# Patient Record
Sex: Male | Born: 1980
Health system: Southern US, Community
[De-identification: ages and names within clinical notes are randomized; demographics above are authoritative.]

## PROBLEM LIST (undated history)

## (undated) DIAGNOSIS — Z9289 Personal history of other medical treatment: Secondary | ICD-10-CM

## (undated) DIAGNOSIS — I10 Essential (primary) hypertension: Secondary | ICD-10-CM

## (undated) HISTORY — PX: TONSILLECTOMY: SUR1361

## (undated) HISTORY — DX: Personal history of other medical treatment: Z92.89

---

## 2007-06-12 ENCOUNTER — Ambulatory Visit (HOSPITAL_BASED_OUTPATIENT_CLINIC_OR_DEPARTMENT_OTHER): Admission: RE | Admit: 2007-06-12 | Discharge: 2007-06-12 | Payer: Self-pay | Admitting: Otolaryngology

## 2007-06-12 ENCOUNTER — Encounter (INDEPENDENT_AMBULATORY_CARE_PROVIDER_SITE_OTHER): Payer: Self-pay | Admitting: Otolaryngology

## 2007-09-12 ENCOUNTER — Emergency Department (HOSPITAL_COMMUNITY): Admission: EM | Admit: 2007-09-12 | Discharge: 2007-09-12 | Payer: Self-pay | Admitting: Emergency Medicine

## 2009-09-17 ENCOUNTER — Emergency Department (HOSPITAL_COMMUNITY): Admission: EM | Admit: 2009-09-17 | Discharge: 2009-09-17 | Payer: Self-pay | Admitting: Emergency Medicine

## 2010-01-15 IMAGING — CR DG FOOT COMPLETE 3+V*L*
3 series · 3 of 3 positions shown · non-contrast
Comparison: None.

CLINICAL DATA: Left foot laceration.  Question foreign body over
the fifth metatarsal bone.

LEFT FOOT - COMPLETE 3+ VIEW

[view not recorded (1 of 3)]
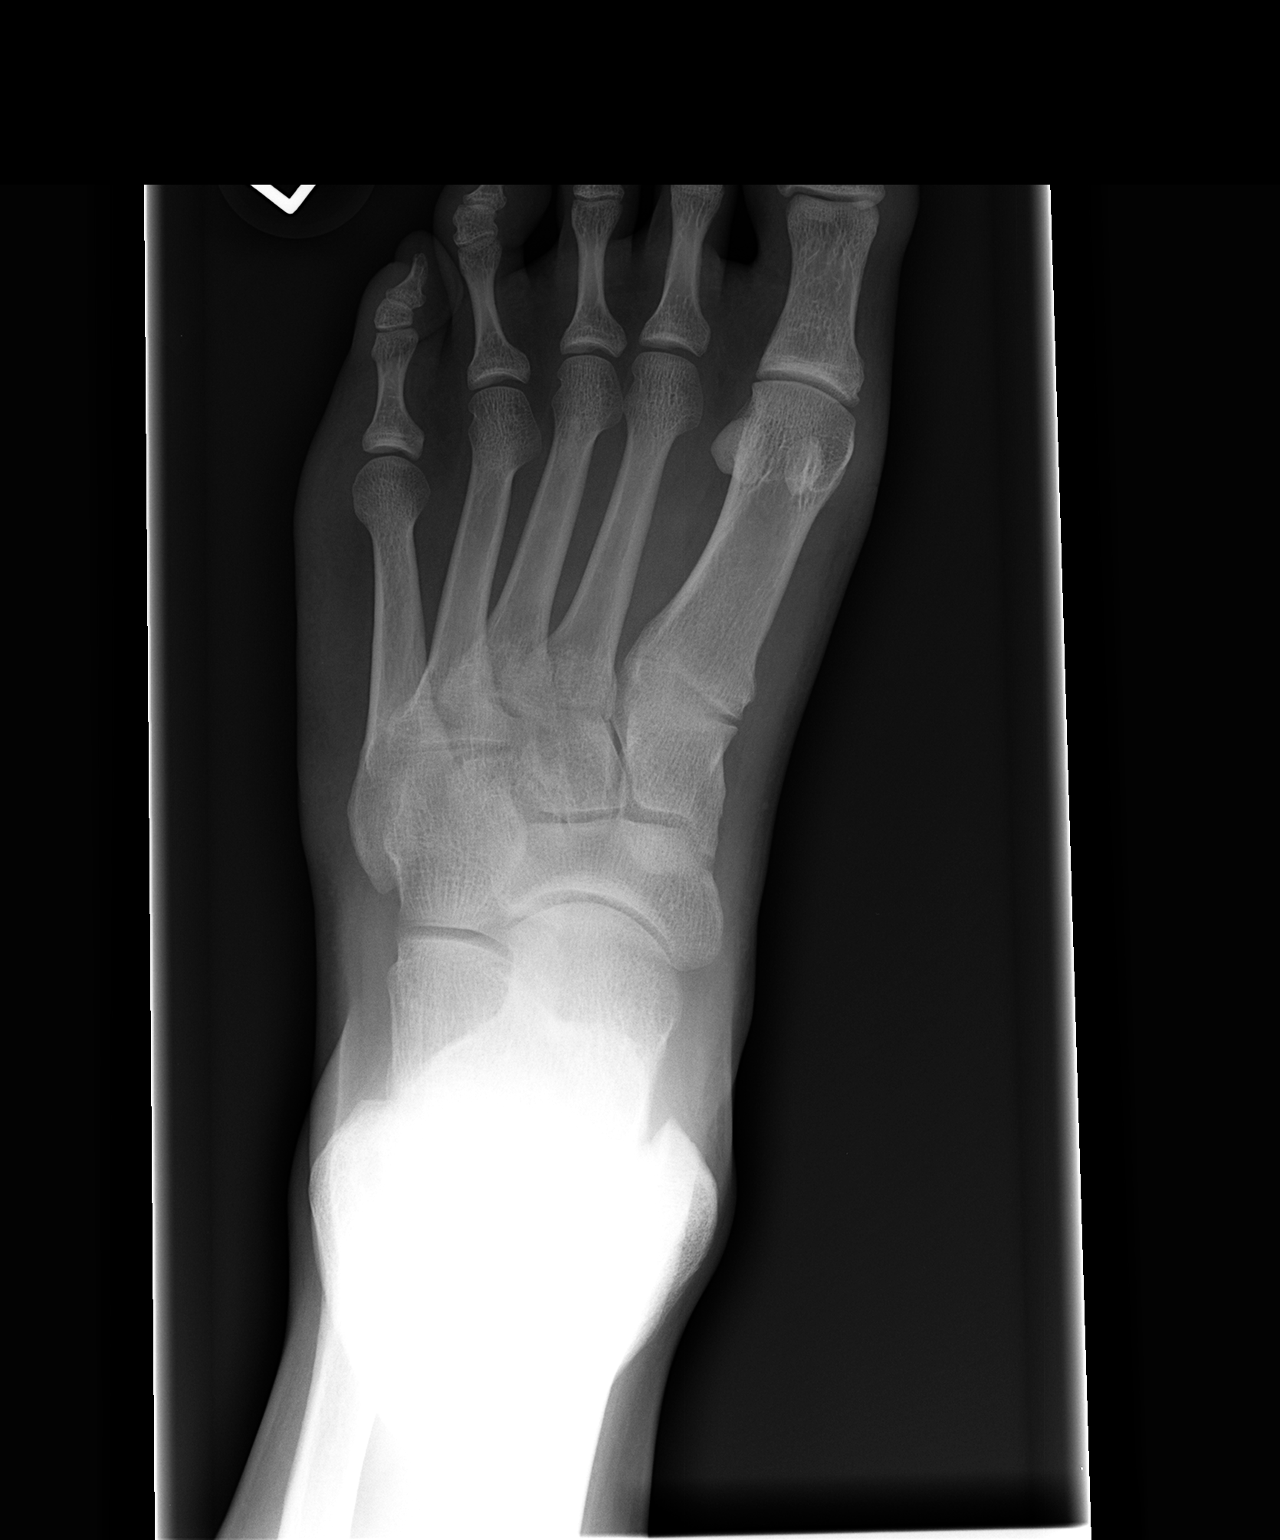

[view not recorded (2 of 3)]
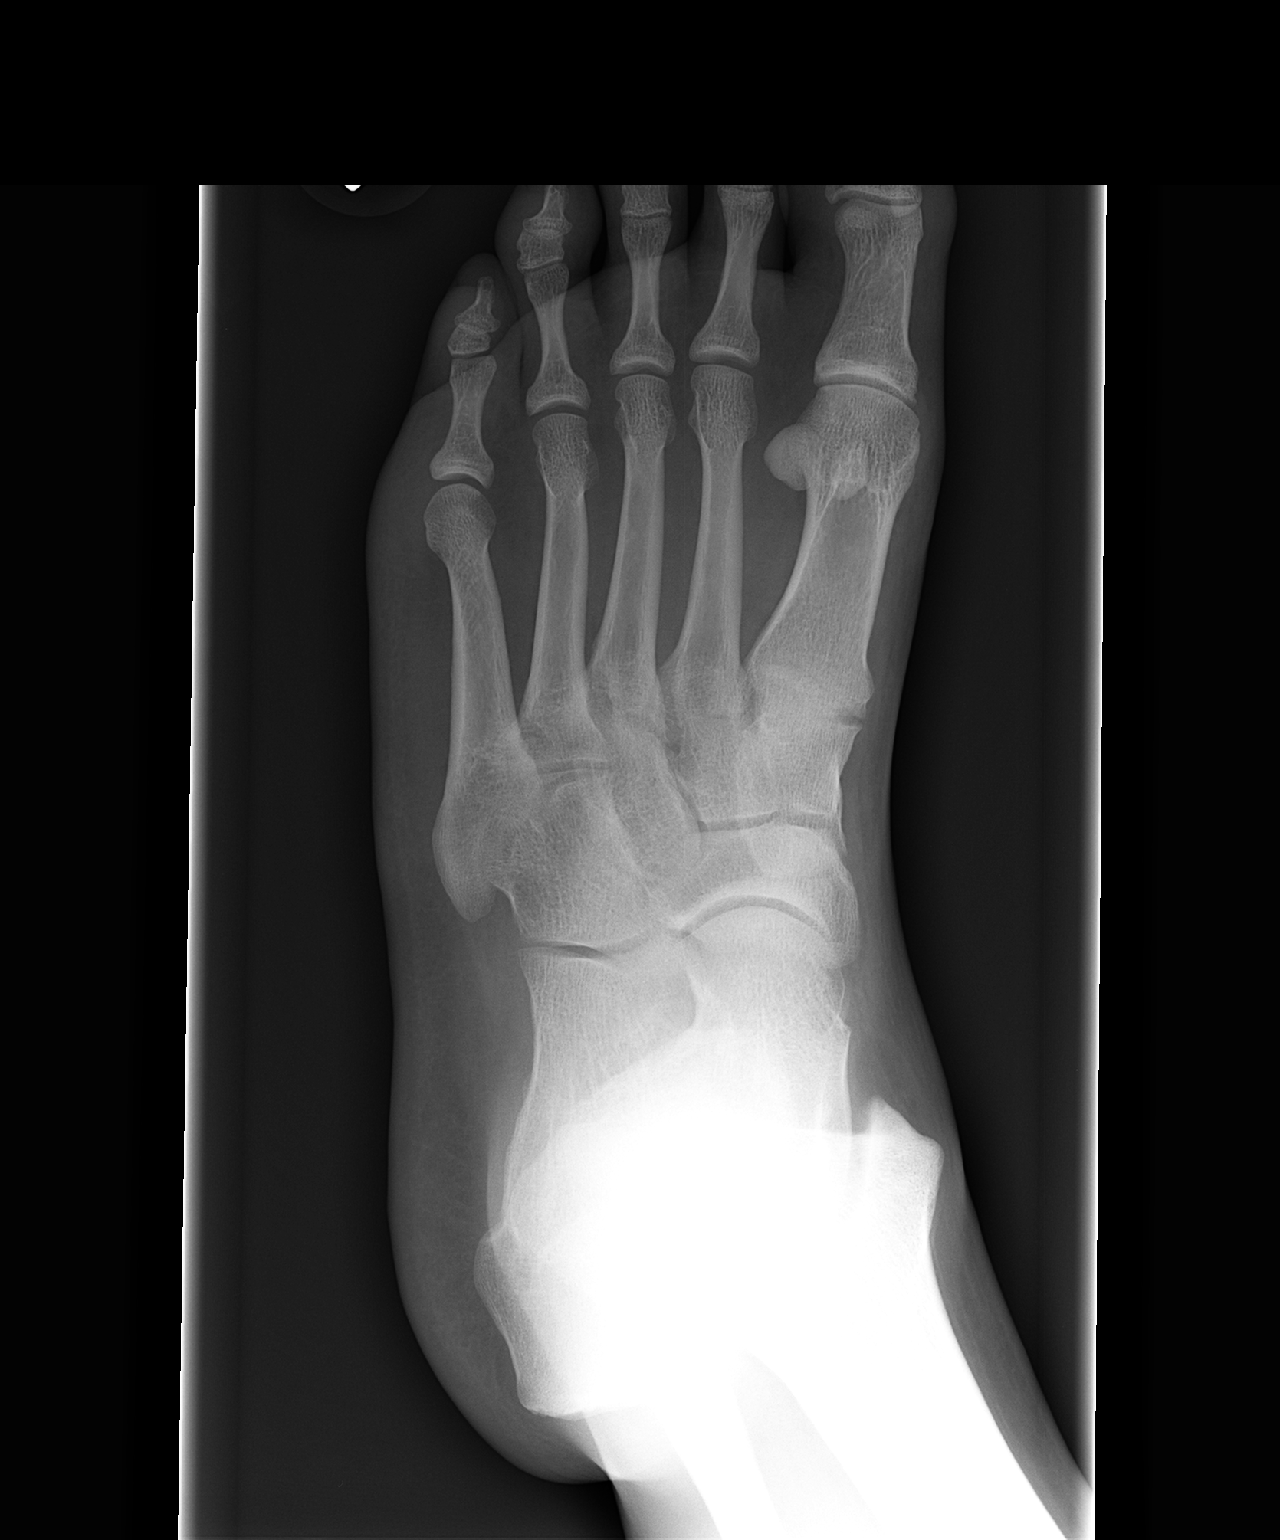

[view not recorded (3 of 3)]
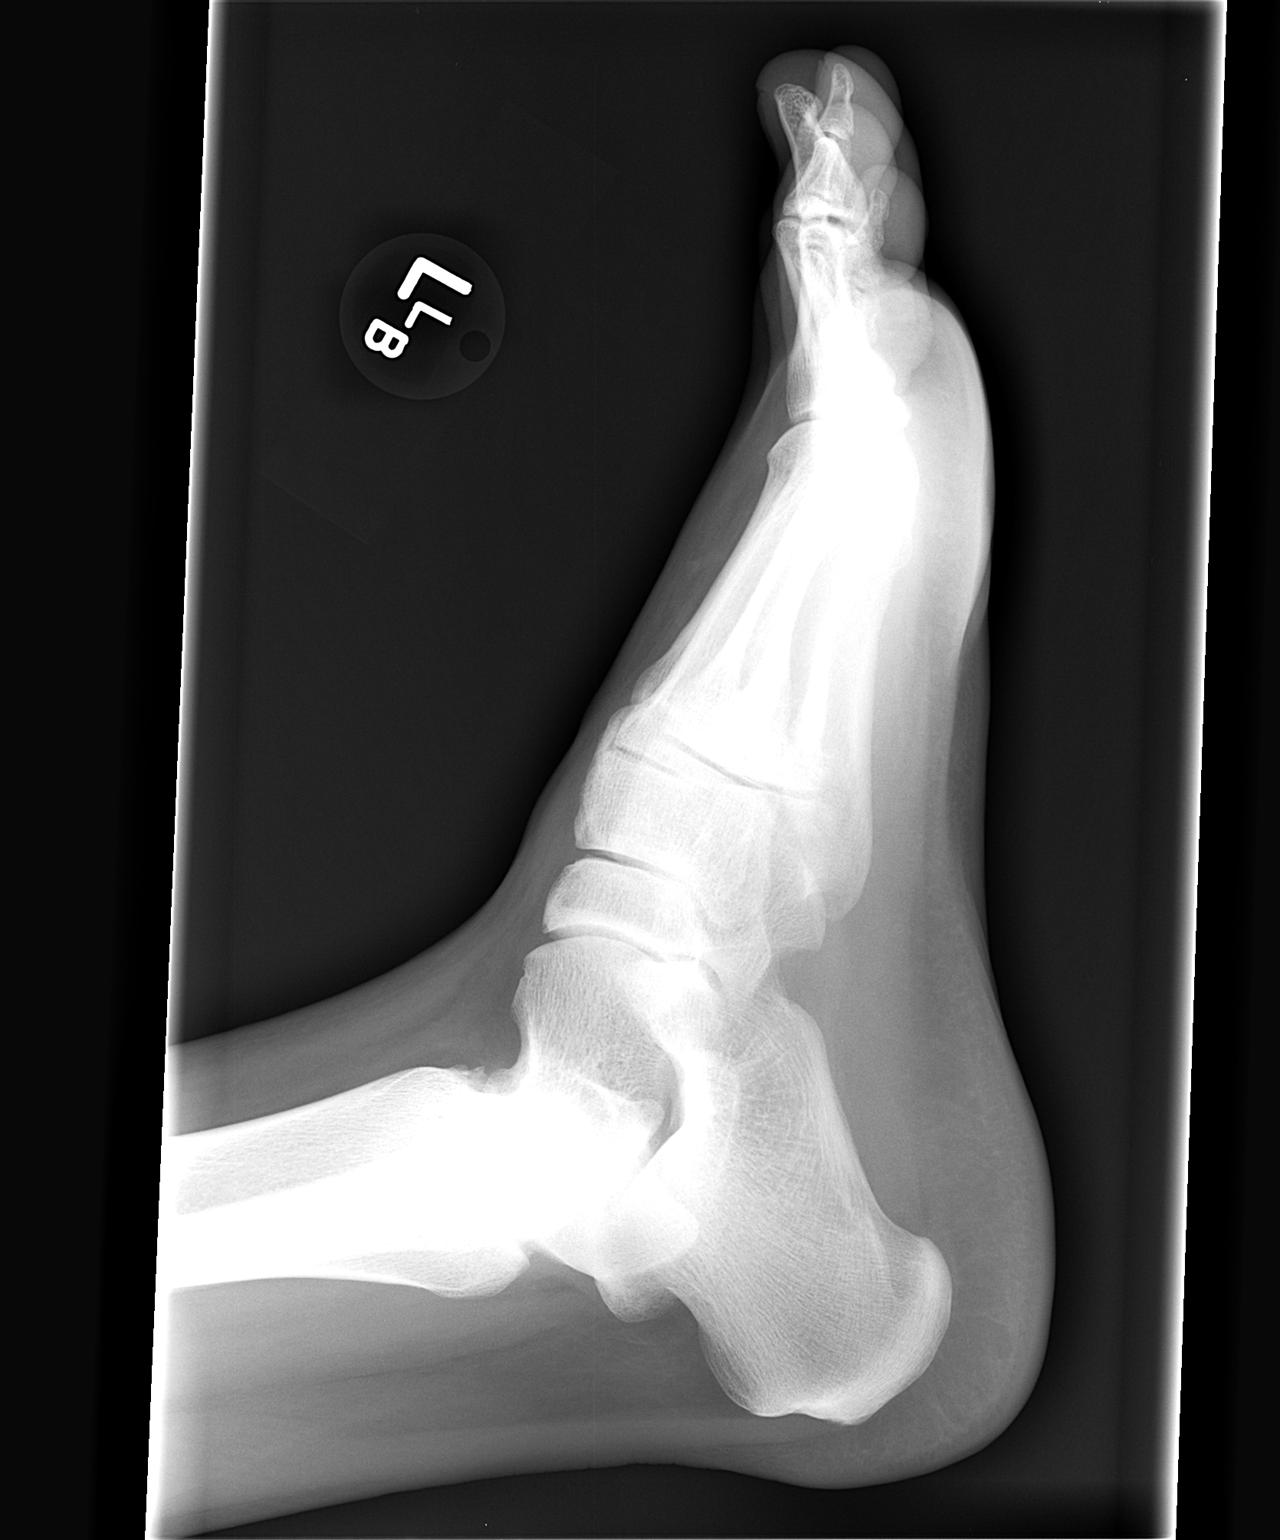

[3 of 3 positions shown; findings below may reference images not displayed]

FINDINGS: There is soft tissue swelling over the lateral aspect the
foot.  No underlying radiopaque foreign body is seen.  There is no
evidence for fracture or dislocation.  The foot is otherwise
unremarkable.
IMPRESSION: 1.  Soft tissue swelling over lateral aspect the foot without
underlying foreign body, fracture, or dislocation.

## 2010-06-10 LAB — POCT I-STAT, CHEM 8
BUN: 8 mg/dL (ref 6–23)
Calcium, Ion: 1.17 mmol/L (ref 1.12–1.32)
Creatinine, Ser: 1 mg/dL (ref 0.4–1.5)
Glucose, Bld: 109 mg/dL — ABNORMAL HIGH (ref 70–99)
Hemoglobin: 16 g/dL (ref 13.0–17.0)
Sodium: 137 mEq/L (ref 135–145)
TCO2: 26 mmol/L (ref 0–100)

## 2010-08-07 NOTE — Op Note (Signed)
NAME:  JASKIRAT, SCHWIEGER NO.:  [id number]   MEDICAL RECORD NO.:  [id number]          PATIENT TYPE:  AMB   LOCATION:  DSC                          FACILITY:  [facility]   PHYSICIAN:  Antony Contras, MD     DATE OF BIRTH:  10/02/1980   DATE OF PROCEDURE:  06/12/2007  DATE OF DISCHARGE:                               OPERATIVE REPORT   PREOPERATIVE DIAGNOSIS:  Chronic tonsillitis and tonsilliths.   POSTOPERATIVE DIAGNOSIS:  Chronic tonsillitis and tonsilliths.   PROCEDURE:  Tonsillectomy.   SURGEON:  Christia Reading, MD.   ANESTHESIA:  General endotracheal anesthesia.   COMPLICATIONS:  None.   INDICATION:  The patient is a 30 year old white male who has a long  history of debris collecting in his tonsils that has become quite  aggravating and common.  It causes bad breath.  He presents to the  operating room for surgical management.   FINDINGS:  The tonsils were 1+ in size bilaterally with crypt debris in  both sides.   DESCRIPTION OF PROCEDURE:  The patient was identified in the holding  room and informed consent having been obtained, including discussion of  risks, benefits, and alternatives, the patient was brought to the  operative suite and put on the operative table in the supine position.  Anesthesia was induced and patient was intubated by an anesthesia team  using a glide scope.  The eyes were draped closed and bed was turned 90  degrees from anesthesia.  The patient was given intravenous antibiotics  and steroids during the case.  A head wrap was placed around the  patient's head and a Crowe-Davis retractor was inserted in the mouth and  opened to reveal the oropharynx.  It was placed in suspension on the  Mayo stand.  The left tonsil was grasped with a curved Allis and  retracted medially while a curvilinear incision was made along the  anterior tonsillar pillar using the coblator on a setting of 7.  Dissection continued in the subcapsular plane until the  tonsil was  removed.  The tonsil was passed to nursing for pathology.  Bleeding was  then controlled using suction cautery on a setting of 30.  The same  procedure was then carried out on the right side.  After this, the nose  and throat were copiously irrigated with saline and a flexible catheter  was passed down the esophagus to suck out the stomach and esophagus.  The retractor was taken out of the suspension and removed from the  patient's mouth and he was turned back to anesthesia for wake-up and was  extubated and moved to the recovery room in stable condition.     Antony Contras, MD  Electronically Signed    DDB/MEDQ  D:  06/12/2007  T:  06/12/2007  Job:  [id number]

## 2010-12-17 LAB — POCT I-STAT, CHEM 8
Calcium, Ion: 1.24
Creatinine, Ser: 0.8
Glucose, Bld: 94
Hemoglobin: 14.6
Potassium: 4

## 2015-03-08 ENCOUNTER — Emergency Department (INDEPENDENT_AMBULATORY_CARE_PROVIDER_SITE_OTHER)
Admission: EM | Admit: 2015-03-08 | Discharge: 2015-03-08 | Disposition: A | Discharge status: Home / Self Care | Payer: BLUE CROSS/BLUE SHIELD | Source: Home / Self Care | Attending: Family Medicine | Admitting: Family Medicine

## 2015-03-08 ENCOUNTER — Encounter (HOSPITAL_COMMUNITY): Payer: Self-pay | Admitting: *Deleted

## 2015-03-08 DIAGNOSIS — I10 Essential (primary) hypertension: Secondary | ICD-10-CM | POA: Diagnosis not present

## 2015-03-08 HISTORY — DX: Essential (primary) hypertension: I10

## 2015-03-08 LAB — POCT I-STAT, CHEM 8
BUN: 13 mg/dL (ref 6–20)
CALCIUM ION: 1.23 mmol/L (ref 1.12–1.23)
CREATININE: 0.9 mg/dL (ref 0.61–1.24)
Chloride: 101 mmol/L (ref 101–111)
GLUCOSE: 98 mg/dL (ref 65–99)
HCT: 46 % (ref 39.0–52.0)
HEMOGLOBIN: 15.6 g/dL (ref 13.0–17.0)
Potassium: 3.8 mmol/L (ref 3.5–5.1)
Sodium: 140 mmol/L (ref 135–145)
TCO2: 27 mmol/L (ref 0–100)

## 2015-03-08 MED ORDER — LISINOPRIL-HYDROCHLOROTHIAZIDE 10-12.5 MG PO TABS: 1.0000 | ORAL_TABLET | Freq: Every day | ORAL | Status: DC

## 2015-03-08 NOTE — ED Provider Notes (Signed)
CSN: [id number]     Arrival date & time 03/08/15  1533 History   First MD Initiated Contact with Patient 03/08/15 1703     Chief Complaint  Patient presents with  . Hypertension   (Consider location/radiation/quality/duration/timing/severity/associated sxs/prior Treatment) Patient is a 34 y.o. male presenting with hypertension. The history is provided by the patient.  Hypertension This is a chronic problem. The current episode started 3 to 5 hours ago (h/o hbp,  no med for sev yrs, today was found to have elevated bp 160/100. has regained wt lost when first put on meds.). The problem has not changed since onset.Pertinent negatives include no chest pain and no shortness of breath. Associated symptoms comments: None..    Past Medical History  Diagnosis Date  . Hypertension    History reviewed. No pertinent past surgical history. History reviewed. No pertinent family history. Social History  Substance Use Topics  . Smoking status: Never Smoker   . Smokeless tobacco: None  . Alcohol Use: Yes    Review of Systems  Constitutional: Negative.   Respiratory: Negative.  Negative for cough, shortness of breath and wheezing.   Cardiovascular: Negative.  Negative for chest pain, palpitations and leg swelling.  Neurological: Negative.   All other systems reviewed and are negative.   Allergies  Review of patient's allergies indicates no known allergies.  Home Medications   Prior to Admission medications   Medication Sig Start Date End Date Taking? Authorizing Provider  lisinopril-hydrochlorothiazide (PRINZIDE,ZESTORETIC) 10-12.5 MG tablet Take 1 tablet by mouth daily. 03/08/15   Linna HoffJames D Kryssa Risenhoover, MD   Meds Ordered and Administered this Visit  Medications - No data to display  BP 165/106 mmHg  Pulse 83  Temp(Src) 98.4 F (36.9 C) (Oral)  Resp 16  SpO2 97% No data found.   Physical Exam  Constitutional: He is oriented to person, place, and time. He appears well-developed and  well-nourished.  Neck: Normal range of motion. Neck supple.  Cardiovascular: Normal rate, regular rhythm, normal heart sounds and intact distal pulses.   Pulmonary/Chest: Effort normal and breath sounds normal.  Musculoskeletal: He exhibits no edema.  Neurological: He is alert and oriented to person, place, and time.  Skin: Skin is warm and dry.  Nursing note and vitals reviewed.   ED Course  Procedures (including critical care time)  Labs Review Labs Reviewed  POCT I-STAT, CHEM 8   i-stat wnl.  Imaging Review No results found.   Visual Acuity Review  Right Eye Distance:   Left Eye Distance:   Bilateral Distance:    Right Eye Near:   Left Eye Near:    Bilateral Near:         MDM   1. Essential hypertension        Linna HoffJames D Thai Burgueno, MD 03/08/15 1843

## 2015-03-08 NOTE — ED Notes (Signed)
Pt  Reports      He  Has  A  History     Of  Hypertension in  Past        He    Used  To  Be  On lisinopril     He  Has  Not  Had  Any in  4  Years          He     Reports  His  bp  Was  High   Today  When he  Was  Getting a  Work  Physical     He  denys any symptoms

## 2015-03-08 NOTE — Discharge Instructions (Signed)
See your doctor in 2-3 weeks for recheck of bp and refill of medicine as indicated.

## 2015-11-03 DIAGNOSIS — I10 Essential (primary) hypertension: Secondary | ICD-10-CM | POA: Diagnosis not present

## 2015-11-03 DIAGNOSIS — Z6835 Body mass index (BMI) 35.0-35.9, adult: Secondary | ICD-10-CM | POA: Diagnosis not present

## 2015-12-14 DIAGNOSIS — Z0001 Encounter for general adult medical examination with abnormal findings: Secondary | ICD-10-CM | POA: Diagnosis not present

## 2015-12-19 DIAGNOSIS — Z23 Encounter for immunization: Secondary | ICD-10-CM | POA: Diagnosis not present

## 2015-12-19 DIAGNOSIS — I10 Essential (primary) hypertension: Secondary | ICD-10-CM | POA: Diagnosis not present

## 2016-02-22 MED FILL — LISINOPRIL 20 MG TABLET: 20 | 90 days supply | Qty: 90 | Fill #0

## 2016-03-27 DIAGNOSIS — Z3141 Encounter for fertility testing: Secondary | ICD-10-CM | POA: Diagnosis not present

## 2016-03-30 ENCOUNTER — Observation Stay (HOSPITAL_COMMUNITY)
Admission: EM | Admit: 2016-03-30 | Discharge: 2016-03-31 | Disposition: A | Discharge status: Home / Self Care | Payer: 59 | Attending: Nephrology | Admitting: Nephrology

## 2016-03-30 ENCOUNTER — Emergency Department (HOSPITAL_COMMUNITY): Payer: 59

## 2016-03-30 ENCOUNTER — Encounter (HOSPITAL_COMMUNITY): Payer: Self-pay | Admitting: *Deleted

## 2016-03-30 DIAGNOSIS — Z79899 Other long term (current) drug therapy: Secondary | ICD-10-CM | POA: Diagnosis not present

## 2016-03-30 DIAGNOSIS — R778 Other specified abnormalities of plasma proteins: Secondary | ICD-10-CM | POA: Diagnosis present

## 2016-03-30 DIAGNOSIS — R7989 Other specified abnormal findings of blood chemistry: Secondary | ICD-10-CM | POA: Diagnosis present

## 2016-03-30 DIAGNOSIS — R05 Cough: Secondary | ICD-10-CM | POA: Insufficient documentation

## 2016-03-30 DIAGNOSIS — J3489 Other specified disorders of nose and nasal sinuses: Secondary | ICD-10-CM | POA: Diagnosis not present

## 2016-03-30 DIAGNOSIS — I10 Essential (primary) hypertension: Secondary | ICD-10-CM | POA: Diagnosis not present

## 2016-03-30 DIAGNOSIS — R0789 Other chest pain: Secondary | ICD-10-CM | POA: Diagnosis not present

## 2016-03-30 DIAGNOSIS — R0981 Nasal congestion: Secondary | ICD-10-CM | POA: Diagnosis not present

## 2016-03-30 DIAGNOSIS — R079 Chest pain, unspecified: Secondary | ICD-10-CM

## 2016-03-30 LAB — TROPONIN I
Troponin I: 0.05 ng/mL (ref <0.03)
Troponin I: 0.07 ng/mL (ref <0.03)

## 2016-03-30 LAB — BASIC METABOLIC PANEL
Anion gap: 5 (ref 5–15)
BUN: 15 mg/dL (ref 6–20)
CALCIUM: 9.1 mg/dL (ref 8.9–10.3)
CO2: 29 mmol/L (ref 22–32)
CREATININE: 0.99 mg/dL (ref 0.61–1.24)
Chloride: 101 mmol/L (ref 101–111)
GFR calc Af Amer: >60 mL/min (ref >60)
GFR calc non Af Amer: >60 mL/min (ref >60)
GLUCOSE: 103 mg/dL — AB (ref 65–99)
Potassium: 4 mmol/L (ref 3.5–5.1)
Sodium: 135 mmol/L (ref 135–145)

## 2016-03-30 LAB — CBC
HCT: 42.5 % (ref 39.0–52.0)
Hemoglobin: 14.6 g/dL (ref 13.0–17.0)
MCH: 30.4 pg (ref 26.0–34.0)
MCHC: 34.4 g/dL (ref 30.0–36.0)
MCV: 88.4 fL (ref 78.0–100.0)
PLATELETS: 315 10*3/uL (ref 150–400)
RBC: 4.81 MIL/uL (ref 4.22–5.81)
RDW: 12.1 % (ref 11.5–15.5)
WBC: 10 10*3/uL (ref 4.0–10.5)

## 2016-03-30 MED ORDER — PANTOPRAZOLE SODIUM 20 MG PO TBEC: 20.0000 mg | DELAYED_RELEASE_TABLET | Freq: Every day | ORAL | 0 refills | Status: DC

## 2016-03-30 NOTE — ED Provider Notes (Signed)
[facility]-EMERGENCY DEPT Provider Note   CSN: [id number] Arrival date & time: 03/30/16  1618     History   Chief Complaint Chief Complaint  Patient presents with  . Chest Pain  . Hypertension    HPI Daniel [place] is a 36 y.o. male.  HPI Patient presents with episodic burning central chest pain over the last 2 days. States that the pain occurs after eating. Had the pain earlier this evening after eating beef stew with [facility] hot sauce. No associated shortness of breath, diaphoresis, nausea or vomiting. Pain is now subsided. Patient is also had URI symptoms including nasal congestion and cough. He is taking over-the-counter medications. States he was not feeling right today and his blood pressure which was significantly elevated. Denied any visual changes, speech changes, focal weakness or numbness. Patient states the symptoms have since resolved. States he's been compliant with a pressure medication. Past Medical History:  Diagnosis Date  . Hypertension     There are no active problems to display for this patient.   Past Surgical History:  Procedure Laterality Date  . TONSILLECTOMY         Home Medications    Prior to Admission medications   Medication Sig Start Date End Date Taking? Authorizing Provider  Bioflavonoid Products (VITAMIN C) CHEW Chew 1 tablet by mouth daily as needed (for cold prevention).   Yes Historical Provider, MD  lisinopril (PRINIVIL,ZESTRIL) 20 MG tablet Take 20 mg by mouth daily. 02/22/16  Yes Historical Provider, MD  pseudoephedrine-acetaminophen (TYLENOL SINUS) 30-500 MG TABS tablet Take 1-2 tablets by mouth daily as needed (for cold smyptoms).   Yes Historical Provider, MD  pseudoephedrine-guaifenesin (MUCINEX D) 60-600 MG 12 hr tablet Take 1 tablet by mouth daily as needed for congestion.   Yes Historical Provider, MD  pantoprazole (PROTONIX) 20 MG tablet Take 1 tablet (20 mg total) by mouth daily. 03/30/16   Loren Raceravid Juanitta Earnhardt, MD    Family  History No family history on file.  Social History Social History  Substance Use Topics  . Smoking status: Never Smoker  . Smokeless tobacco: Never Used  . Alcohol use Yes     Allergies   Patient has no known allergies.   Review of Systems Review of Systems  Constitutional: Negative for chills and fever.  HENT: Positive for congestion and rhinorrhea. Negative for sore throat.   Eyes: Negative for visual disturbance.  Respiratory: Positive for cough. Negative for shortness of breath.   Cardiovascular: Positive for chest pain. Negative for palpitations and leg swelling.  Gastrointestinal: Negative for abdominal pain, constipation, diarrhea, nausea and vomiting.  Genitourinary: Negative for dysuria, flank pain, frequency and hematuria.  Musculoskeletal: Negative for arthralgias, back pain, gait problem, joint swelling, myalgias, neck pain and neck stiffness.  Skin: Negative for pallor, rash and wound.  Neurological: Negative for dizziness, syncope, weakness, light-headedness, numbness and headaches.  All other systems reviewed and are negative.    Physical Exam Updated Vital Signs BP 131/86   Pulse 61   Temp 98.5 F (36.9 C) (Temporal)   Resp 19   Ht 6\' 2"  (1.88 m)   Wt 260 lb (117.9 kg)   SpO2 98%   BMI 33.38 kg/m   Physical Exam  Constitutional: He is oriented to person, place, and time. He appears well-developed and well-nourished. No distress.  HENT:  Head: Normocephalic and atraumatic.  Mouth/Throat: Oropharynx is clear and moist. No oropharyngeal exudate.  Eyes: EOM are normal. Pupils are equal, round, and reactive to light.  Neck: Normal range of motion. Neck supple. No JVD present.  Cardiovascular: Normal rate and regular rhythm.  Exam reveals no gallop and no friction rub.   No murmur heard. Pulmonary/Chest: Effort normal and breath sounds normal. No respiratory distress. He has no wheezes. He has no rales. He exhibits no tenderness.  Abdominal: Soft.  Bowel sounds are normal. There is no tenderness. There is no rebound and no guarding.  Musculoskeletal: Normal range of motion. He exhibits no edema or tenderness.  No lower extremity swelling, asymmetry or tenderness.  Neurological: He is alert and oriented to person, place, and time.  Moves all extremities without deficit. Sensation intact.  Skin: Skin is warm and dry. Capillary refill takes less than 2 seconds. No rash noted. No erythema.  Psychiatric: He has a normal mood and affect. His behavior is normal.  Nursing note and vitals reviewed.    ED Treatments / Results  Labs (all labs ordered are listed, but only abnormal results are displayed) Labs Reviewed  BASIC METABOLIC PANEL - Abnormal; Notable for the following:       Result Value   Glucose, Bld 103 (*)    All other components within normal limits  TROPONIN I - Abnormal; Notable for the following:    Troponin I 0.05 (*)    All other components within normal limits  CBC  TROPONIN I  TROPONIN I    EKG  EKG Interpretation  Date/Time:  Saturday March 30 2016 16:27:29 EST Ventricular Rate:  79 PR Interval:  172 QRS Duration: 88 QT Interval:  374 QTC Calculation: 428 R Axis:   72 Text Interpretation:  Normal sinus rhythm Normal ECG Confirmed by Ranae Palms  MD, Kvion Shapley ([id number]) on 03/30/2016 6:34:36 PM       Radiology Dg Chest 2 View  Result Date: 03/30/2016 CLINICAL DATA:  Cough for 1 week.  Congestion. EXAM: CHEST  2 VIEW COMPARISON:  None. FINDINGS: Lungs are clear. Heart size and pulmonary vascularity are normal. No adenopathy. No bone lesions. IMPRESSION: No edema or consolidation. Electronically Signed   By: Bretta Bang III M.D.   On: 03/30/2016 16:51    Procedures Procedures (including critical care time)  Medications Ordered in ED Medications - No data to display   Initial Impression / Assessment and Plan / ED Course  I have reviewed the triage vital signs and the nursing notes.  Pertinent labs &  imaging results that were available during my care of the patient were reviewed by me and considered in my medical decision making (see chart for details).  Clinical Course    Patient very well-appearing. He is symptom-free currently. EKG and initial troponin are normal. We'll get repeat troponin. Suspect symptoms are likely gastric intestinal in origin. Advised on dietary changes and will start PPI. Repeat troponin is 0.05. Discuss with cardiology resident recommend repeat troponin 3 hours. If stable condition discharged home. Patient continues to be symptom-free.  Signed out to oncoming emergency physician.  Final Clinical Impressions(s) / ED Diagnoses   Final diagnoses:  Atypical chest pain  Hypertension, unspecified type    New Prescriptions New Prescriptions   PANTOPRAZOLE (PROTONIX) 20 MG TABLET    Take 1 tablet (20 mg total) by mouth daily.     Loren Racer, MD 03/30/16 2212

## 2016-03-30 NOTE — ED Notes (Signed)
CRITICAL VALUE ALERT  Critical value received: Troponin 0.05 Date of notification: 03/30/16 Time of notification:  2141 Critical value read back:Yes.   Nurse who received alert:  GMP MD notified (1st page):  Ranae PalmsYelverton Time of first page: 2141 Responding MD: Ranae PalmsYelverton Time MD responded:  2141

## 2016-03-30 NOTE — ED Triage Notes (Signed)
Pt c/o chest pain that started 2 days ago. Pt's reports he took his BP at home and it was 190/123 and he takes BP medications. Denies SOB, radiation of pain to back or arms, nausea, vomiting. Reports cough and that he has been taking OTC medication and wonders if this is what is causing the increased BP.

## 2016-03-31 ENCOUNTER — Observation Stay (HOSPITAL_BASED_OUTPATIENT_CLINIC_OR_DEPARTMENT_OTHER): Payer: 59

## 2016-03-31 ENCOUNTER — Encounter (HOSPITAL_COMMUNITY): Payer: Self-pay | Admitting: Internal Medicine

## 2016-03-31 DIAGNOSIS — R0981 Nasal congestion: Secondary | ICD-10-CM | POA: Diagnosis not present

## 2016-03-31 DIAGNOSIS — R05 Cough: Secondary | ICD-10-CM | POA: Diagnosis not present

## 2016-03-31 DIAGNOSIS — R079 Chest pain, unspecified: Secondary | ICD-10-CM

## 2016-03-31 DIAGNOSIS — R778 Other specified abnormalities of plasma proteins: Secondary | ICD-10-CM | POA: Diagnosis present

## 2016-03-31 DIAGNOSIS — I10 Essential (primary) hypertension: Secondary | ICD-10-CM | POA: Diagnosis not present

## 2016-03-31 DIAGNOSIS — R0789 Other chest pain: Secondary | ICD-10-CM

## 2016-03-31 DIAGNOSIS — Z79899 Other long term (current) drug therapy: Secondary | ICD-10-CM | POA: Diagnosis not present

## 2016-03-31 DIAGNOSIS — I209 Angina pectoris, unspecified: Secondary | ICD-10-CM | POA: Diagnosis not present

## 2016-03-31 DIAGNOSIS — R7989 Other specified abnormal findings of blood chemistry: Secondary | ICD-10-CM

## 2016-03-31 DIAGNOSIS — J3489 Other specified disorders of nose and nasal sinuses: Secondary | ICD-10-CM | POA: Diagnosis not present

## 2016-03-31 LAB — COMPREHENSIVE METABOLIC PANEL
ALBUMIN: 4.2 g/dL (ref 3.5–5.0)
ALK PHOS: 44 U/L (ref 38–126)
ALT: 36 U/L (ref 17–63)
AST: 23 U/L (ref 15–41)
Anion gap: 7 (ref 5–15)
BUN: 13 mg/dL (ref 6–20)
CALCIUM: 8.8 mg/dL — AB (ref 8.9–10.3)
CO2: 29 mmol/L (ref 22–32)
CREATININE: 0.82 mg/dL (ref 0.61–1.24)
Chloride: 101 mmol/L (ref 101–111)
GFR calc non Af Amer: >60 mL/min (ref >60)
GLUCOSE: 99 mg/dL (ref 65–99)
Potassium: 3.8 mmol/L (ref 3.5–5.1)
SODIUM: 137 mmol/L (ref 135–145)
Total Bilirubin: 0.8 mg/dL (ref 0.3–1.2)
Total Protein: 6.8 g/dL (ref 6.5–8.1)

## 2016-03-31 LAB — ECHOCARDIOGRAM COMPLETE
Height: 74 in
WEIGHTICAEL: 4292.8 [oz_av]

## 2016-03-31 LAB — CBC
HCT: 40.7 % (ref 39.0–52.0)
Hemoglobin: 14.4 g/dL (ref 13.0–17.0)
MCH: 31.1 pg (ref 26.0–34.0)
MCHC: 35.4 g/dL (ref 30.0–36.0)
MCV: 87.9 fL (ref 78.0–100.0)
PLATELETS: 299 10*3/uL (ref 150–400)
RBC: 4.63 MIL/uL (ref 4.22–5.81)
RDW: 12.1 % (ref 11.5–15.5)
WBC: 9.2 10*3/uL (ref 4.0–10.5)

## 2016-03-31 LAB — TROPONIN I
TROPONIN I: 0.07 ng/mL — AB (ref <0.03)
Troponin I: 0.06 ng/mL (ref <0.03)

## 2016-03-31 LAB — LIPID PANEL
CHOL/HDL RATIO: 5.8 ratio
CHOLESTEROL: 185 mg/dL (ref 0–200)
HDL: 32 mg/dL — ABNORMAL LOW (ref >40)
LDL Cholesterol: 122 mg/dL — ABNORMAL HIGH (ref 0–99)
TRIGLYCERIDES: 157 mg/dL — AB (ref <150)
VLDL: 31 mg/dL (ref 0–40)

## 2016-03-31 LAB — CK TOTAL AND CKMB (NOT AT ARMC)
CK TOTAL: 136 U/L (ref 49–397)
CK, MB: 2.3 ng/mL (ref 0.5–5.0)
RELATIVE INDEX: 1.7 (ref 0.0–2.5)

## 2016-03-31 MED ORDER — ATORVASTATIN CALCIUM 40 MG PO TABS: 40.0000 mg | ORAL_TABLET | Freq: Every day | ORAL | 0 refills | Status: DC

## 2016-03-31 MED ORDER — ASPIRIN 81 MG PO CHEW
324.0000 mg | CHEWABLE_TABLET | Freq: Once | ORAL | Status: AC
  Administered 2016-03-31: 324 mg via ORAL
  Filled 2016-03-31: qty 4

## 2016-03-31 MED ORDER — ACETAMINOPHEN 325 MG PO TABS
650.0000 mg | ORAL_TABLET | Freq: Four times a day (QID) | ORAL | Status: DC | PRN
  Administered 2016-03-31: 650 mg via ORAL
  Filled 2016-03-31: qty 2

## 2016-03-31 MED ORDER — HYDRALAZINE HCL 20 MG/ML IJ SOLN: 10.0000 mg | Freq: Four times a day (QID) | INTRAMUSCULAR | Status: DC | PRN

## 2016-03-31 MED ORDER — ACETAMINOPHEN 650 MG RE SUPP: 650.0000 mg | Freq: Four times a day (QID) | RECTAL | Status: DC | PRN

## 2016-03-31 MED ORDER — LISINOPRIL 10 MG PO TABS
20.0000 mg | ORAL_TABLET | Freq: Every day | ORAL | Status: DC
  Administered 2016-03-31: 20 mg via ORAL
  Filled 2016-03-31: qty 2

## 2016-03-31 MED ORDER — ASPIRIN EC 81 MG PO TBEC: 81.0000 mg | DELAYED_RELEASE_TABLET | Freq: Every day | ORAL | 0 refills | Status: DC

## 2016-03-31 MED ORDER — ASPIRIN EC 325 MG PO TBEC
325.0000 mg | DELAYED_RELEASE_TABLET | Freq: Every day | ORAL | Status: DC
  Administered 2016-03-31: 325 mg via ORAL
  Filled 2016-03-31: qty 1

## 2016-03-31 MED ORDER — NITROGLYCERIN 0.4 MG SL SUBL: 0.4000 mg | SUBLINGUAL_TABLET | SUBLINGUAL | Status: DC | PRN

## 2016-03-31 MED ORDER — ENOXAPARIN SODIUM 60 MG/0.6ML ~~LOC~~ SOLN
0.5000 mg/kg | SUBCUTANEOUS | Status: DC
  Administered 2016-03-31: 04:00:00 60 mg via SUBCUTANEOUS
  Filled 2016-03-31: qty 0.6

## 2016-03-31 MED ORDER — ATORVASTATIN CALCIUM 40 MG PO TABS
80.0000 mg | ORAL_TABLET | Freq: Every day | ORAL | Status: AC
  Administered 2016-03-31 (×2): 80 mg via ORAL
  Filled 2016-03-31 (×2): qty 2

## 2016-03-31 NOTE — Discharge Summary (Addendum)
Physician Discharge Summary  Daniel Matthews:[id number] DOB: 07-14-1980 DOA: 03/30/2016  PCP: No PCP Per Patient  Admit date: 03/30/2016 Discharge date: 03/31/2016  Admitted From:Home Disposition: Home    Recommendations for Outpatient Follow-up:  1. Follow up with PCP in 1-2 weeks 2. Please follow up with her cardiologist in 1-2 weeks.   Home Health: No Equipment/Devices: No Discharge Condition: Stable CODE STATUS: Full code Diet recommendation: Heart healthy  Brief/Interim Summary: 36 year old gentleman with history of hypertension presented with epigastric and left-sided chest pain on and off for last 3 days. No radiation and not associated with shortness of breath palpitation. The chest pain is not exertional. EKG negative for any ischemic changes. Patient was found to have mild positive troponin. Echocardiogram was done with normal left ventricular systolic function and no regional wall motion abnormalities. Discussed with the cardiologist regarding the patient. Cardiologist recommended to discharge patient with outpatient follow-up. Patient was restarted on aspirin and Lipitor for dyslipidemia. I discussed with the patient and his wife at bedside in detail. Patient doesn't have chest pain and he feels good. Hemodynamically stable. Patient's wife stated that he has follow-up appointment with PCP in coming Friday, 04/05/2016. They said they'll discuss with her PCP and also look for cardiologist follow-up. I educated patient regarding low salt diet and heart healthy diet. Verbalized understanding. Patient is not a smoker. He has family history of coronary artery disease in both parents.  At this time, patient is chest pain-free. Medically stable to discharge home with outpatient follow-up.  Discharge Diagnoses:  Principal Problem:   Chest pain Active Problems:   Elevated troponin level   Hypertension    Discharge Instructions  Discharge Instructions    Call MD for:  difficulty  breathing, headache or visual disturbances    Complete by:  As directed    Call MD for:  extreme fatigue    Complete by:  As directed    Call MD for:  hives    Complete by:  As directed    Call MD for:  persistant dizziness or light-headedness    Complete by:  As directed    Call MD for:  persistant nausea and vomiting    Complete by:  As directed    Call MD for:  severe uncontrolled pain    Complete by:  As directed    Call MD for:  temperature >100.4    Complete by:  As directed    Diet - low sodium heart healthy    Complete by:  As directed    Discharge instructions    Complete by:  As directed    Please follow up with cardiologist in 1-2 weeks. You may need further testing.   Increase activity slowly    Complete by:  As directed      Allergies as of 03/31/2016   No Known Allergies     Medication List    STOP taking these medications   pseudoephedrine-guaifenesin 60-600 MG 12 hr tablet Commonly known as:  MUCINEX D     TAKE these medications   aspirin EC 81 MG tablet Take 1 tablet (81 mg total) by mouth daily.   atorvastatin 40 MG tablet Commonly known as:  LIPITOR Take 1 tablet (40 mg total) by mouth daily.   lisinopril 20 MG tablet Commonly known as:  PRINIVIL,ZESTRIL Take 20 mg by mouth daily.   pseudoephedrine-acetaminophen 30-500 MG Tabs tablet Commonly known as:  TYLENOL SINUS Take 1-2 tablets by mouth daily as needed (for cold  smyptoms).   Vitamin C Chew Chew 1 tablet by mouth daily as needed (for cold prevention).      Follow-up Information    Schedule an appointment as soon as possible for a visit with [name].   Contact information: [address] [place] [address]       [facility] EMERGENCY DEPARTMENT Follow up.   Specialty:  Emergency Medicine Why:  As needed, If symptoms worsen Contact information: [id number] [place] [address] [phone]       Carylon Perches,  MD Follow up on 04/05/2016.   Specialty:  Internal Medicine Why:  please discuss with your PCP for cardiology referral and follow ups. Contact information: [address] [place] [address] [phone]          No Known Allergies  Consultations: Cardiologist   Procedures/Studies: Echocardiogram  Subjective: Patient was seen and examined at bedside. Reported doing good. Denied headache, dizziness, chest pain, shortness of breath, nausea, vomiting or abdominal pain. Reported feeling fine and ready to go home. He will follow up with PCP and cardiologist outpatient. Wife at bedside.   Discharge Exam: Vitals:   03/31/16 0400 03/31/16 1034  BP: 121/81 138/82  Pulse: 77   Resp: (!) 21   Temp: 97.6 F (36.4 C)    Vitals:   03/31/16 0245 03/31/16 0300 03/31/16 0400 03/31/16 1034  BP:  130/80 121/81 138/82  Pulse: 66 71 77   Resp: 18 21 (!) 21   Temp:   97.6 F (36.4 C)   TempSrc:   Oral   SpO2: 98% 96% 100%   Weight:   121.7 kg (268 lb 4.8 oz)   Height:        General: Pt is alert, awake, not in acute distress Cardiovascular: RRR, S1/S2 +, no rubs, no gallops Respiratory: CTA bilaterally, no wheezing, no rhonchi Abdominal: Soft, NT, ND, bowel sounds + Extremities: no edema, no cyanosis    The results of significant diagnostics from this hospitalization (including imaging, microbiology, ancillary and laboratory) are listed below for reference.     Microbiology: No results found for this or any previous visit (from the past 240 hour(s)).   Labs: BNP (last 3 results) No results for input(s): BNP in the last 8760 hours. Basic Metabolic Panel:  Recent Labs Lab 03/30/16 1656 03/31/16 0406  NA 135 137  K 4.0 3.8  CL 101 101  CO2 29 29  GLUCOSE 103* 99  BUN 15 13  CREATININE 0.99 0.82  CALCIUM 9.1 8.8*   Liver Function Tests:  Recent Labs Lab 03/31/16 0406  AST 23  ALT 36  ALKPHOS 44  BILITOT 0.8  PROT 6.8  ALBUMIN 4.2   No results  for input(s): LIPASE, AMYLASE in the last 168 hours. No results for input(s): AMMONIA in the last 168 hours. CBC:  Recent Labs Lab 03/30/16 1656 03/31/16 0406  WBC 10.0 9.2  HGB 14.6 14.4  HCT 42.5 40.7  MCV 88.4 87.9  PLT 315 299   Cardiac Enzymes:  Recent Labs Lab 03/30/16 1656 03/30/16 2036 03/30/16 2315 03/31/16 0406 03/31/16 0902  TROPONINI <0.03 0.05* 0.07* 0.07* 0.06*   BNP: Invalid input(s): POCBNP CBG: No results for input(s): GLUCAP in the last 168 hours. D-Dimer No results for input(s): DDIMER in the last 72 hours. Hgb A1c No results for input(s): HGBA1C in the last 72 hours. Lipid Profile  Recent Labs  03/31/16 0406  CHOL 185  HDL 32*  LDLCALC 122*  TRIG 157*  CHOLHDL 5.8   Thyroid function studies No results for input(s): TSH, T4TOTAL, T3FREE, THYROIDAB in the last 72 hours.  Invalid input(s): FREET3 Anemia work up No results for input(s): VITAMINB12, FOLATE, FERRITIN, TIBC, IRON, RETICCTPCT in the last 72 hours. Urinalysis No results found for: COLORURINE, APPEARANCEUR, LABSPEC, PHURINE, GLUCOSEU, HGBUR, BILIRUBINUR, KETONESUR, PROTEINUR, UROBILINOGEN, NITRITE, LEUKOCYTESUR Sepsis Labs Invalid input(s): PROCALCITONIN,  WBC,  LACTICIDVEN Microbiology No results found for this or any previous visit (from the past 240 hour(s)).   Time coordinating discharge: 27 minutes  SIGNED:   Maxie Barbron Prasad Jules Vidovich, MD  [facility] 03/31/2016, 2:11 PM  If 7PM-7AM, please contact night-coverage [email or web address] Password [email or web address]

## 2016-03-31 NOTE — Progress Notes (Signed)
Patient ID: Daniel [place], male   DOB: 08-20-1980, 36 y.o.   MRN: [id number]  Cardiology Attending  I was called by the hospitalist this morning. The patient presented with chest pain and a minimally elevated troponin (0.05, 0.07, 0.07, 0.06). He is now pain free and has a normal ECG with no acute changes. He has had a 2D echo. If it demonstrates normal LV function, then he will be an acceptable risk for discharge home and he will be instructed to call the office in  next week for clinic followup. [facility].   Leonia ReevesGregg Latoy Labriola,M.D.

## 2016-03-31 NOTE — Progress Notes (Signed)
*  PRELIMINARY RESULTS* Echocardiogram 2D Echocardiogram has been performed.  Stacey DrainWhite, Jenney Brester J 03/31/2016, 9:43 AM

## 2016-03-31 NOTE — Progress Notes (Signed)
Discharge instructions read to patient and his wife.  Both verbalized understanding of all instructions.  Discharge to home with wife.

## 2016-03-31 NOTE — ED Provider Notes (Signed)
12:10 AM  Assumed care from Dr. Ranae PalmsYelverton.  Pt is a 36 y.o. male with history of obesity, hypertension who presents to the emergency department with an episode of burning diffuse chest tightness without radiation at 4:30 PM yesterday while at rest. Patient does report that he had just been something and was lying daily. Has had similar pain in her mainly for the past 3 days.His stated shortness of breath, nausea, vomiting, diaphoresis, dizziness. Denies history of diabetes, hyperlipidemia, tobacco use or family history of premature CAD. He has never had a stress test or cardiac Rosacea. At home he measured his blood pressure was 190/123. Wife reports that is normally 140s to 150/90's. He is on lisinopril 20 mg a day take his medication yesterday morning and reports compliance. Blood pressure now 133/83 with a heart rate in the 60s in normal sinus rhythm. He is asymptomatic. Patient's first troponin was negative. Repeat troponin elevated at 0.05. Cardiology was consulted by Dr. Ranae PalmsYelverton who recommended third troponin. The troponin elevated at 0.07. This is in the setting of a normal creatinine. We'll give aspirin. Again he is pain free. We will discuss with cardiology on call.  Patient denies history of PE or DVT.  12:35 AM  D/w Dr. Vonzella NippleWosik with cardiology who feels patient can be admitted to our hospital and be monitored, serial troponins and see cardiology in the morning. It does appear Dr. Purvis SheffieldKoneswaran is on call here at [facility] in the AM.  We'll discuss with hospitalist Dr. Selena BattenKim for admission.  12:55 AM  D/w Dr. Selena BattenKim with hospitalist service. He is concerned that cardiology may not be on call to actually see the patient tomorrow and plans to [facility] the [facility]. He will see the patient for admission to telemetry, observation. I will update cardiologist at [facility] that this will be the plan.  1:30 AM  Updated Dr. Vonzella NippleWosik with cardiology. Cardiology to see patient in consult in the  morning at [facility]. Accepting physician is Dr. Antionette Charpyd.  I reviewed all nursing notes, vitals, pertinent old records, EKGs, labs, imaging (as available).    EKG Interpretation  Date/Time:  Sunday March 31 2016 00:25:57 EST Ventricular Rate:  79 PR Interval:  172 QRS Duration: 96 QT Interval:  389 QTC Calculation: 446 R Axis:   92 Text Interpretation:  Sinus rhythm Borderline right axis deviation No significant change since last tracing Confirmed by WARD,  DO, KRISTEN [phone]([id number]) on 03/31/2016 12:38:14 AM         Layla MawKristen N Ward, DO 03/31/16 0140

## 2016-03-31 NOTE — H&P (Signed)
TRH H&P   Patient Demographics:    Daniel Matthews, is a 36 y.o. male  MRN: [id number]   DOB - Mar 30, 1980  Admit Date - 03/30/2016  Outpatient Primary MD for the patient is No PCP Per Patient Dr. Ouida Sills  Referring MD/NP/PA:  Ward   Outpatient Specialists:   Patient coming from: home  Chief Complaint  Patient presents with  . Chest Pain  . Hypertension      HPI:    Daniel Matthews  is a 36 y.o. male, w hypertension c/o chest pain x 3 days.  Intermittent, last cp starting about 4pm 03/30/2016.  Pt was getting ready to lie down when the chest pain started.  Substernal chest "pressure and burning" per the patient, no radiation.  Slight cough, yellow and green sputum.  Denies fever, chills, palp, sob, n/v, heartburn, brbpr.  His bp was high tonight and therefore pt presented to ED for evaluation.   In ED, EKG nsr at 80, nl axis, early r progression, no st- t changes c/w ischemia.  CXR negative.  Troponin +,  0.07 and therefore pt will be admitted for evaluation of cp Cardiology consulted by ED, appreciate their input    Review of systems:    In addition to the HPI above, No Fever-chills, No Headache, No changes with Vision or hearing, No problems swallowing food or Liquids, No Shortness of Breath, No Abdominal pain, No Nausea or Vommitting, Bowel movements are regular, No Blood in stool or Urine, No dysuria, No new skin rashes or bruises, No new joints pains-aches,  No new weakness, tingling, numbness in any extremity, No recent weight gain or loss, No polyuria, polydypsia or polyphagia, No significant Mental Stressors.  A full 10 point Review of Systems was done, except as stated above, all other Review of Systems were negative.   With Past History of the following :    Past Medical History:  Diagnosis Date  . Hypertension       Past Surgical History:  Procedure  Laterality Date  . TONSILLECTOMY        Social History:     Social History  Substance Use Topics  . Smoking status: Never Smoker  . Smokeless tobacco: Never Used  . Alcohol use No     Lives - at home  Mobility -  Able to walk wihtout assistance    Family History :     Family History  Problem Relation Age of Onset  . Hypertension Mother   . CAD Father     61's  . Heart attack Maternal Grandfather   . Heart attack Paternal Grandfather       Home Medications:   Prior to Admission medications   Medication Sig Start Date End Date Taking? Authorizing Provider  Bioflavonoid Products (VITAMIN C) CHEW Chew 1 tablet by mouth daily as needed (for cold prevention).   Yes Historical Provider,  MD  lisinopril (PRINIVIL,ZESTRIL) 20 MG tablet Take 20 mg by mouth daily. 02/22/16  Yes Historical Provider, MD  pseudoephedrine-acetaminophen (TYLENOL SINUS) 30-500 MG TABS tablet Take 1-2 tablets by mouth daily as needed (for cold smyptoms).   Yes Historical Provider, MD  pseudoephedrine-guaifenesin (MUCINEX D) 60-600 MG 12 hr tablet Take 1 tablet by mouth daily as needed for congestion.   Yes Historical Provider, MD  pantoprazole (PROTONIX) 20 MG tablet Take 1 tablet (20 mg total) by mouth daily. 03/30/16   Loren Raceravid Yelverton, MD     Allergies:    No Known Allergies   Physical Exam:   Vitals  Blood pressure 133/83, pulse 63, temperature 98.5 F (36.9 C), temperature source Temporal, resp. rate 17, height 6\' 2"  (1.88 m), weight 117.9 kg (260 lb), SpO2 98 %.   1. General  lying in bed in NAD,    2. Normal affect and insight, Not Suicidal or Homicidal, Awake Alert, Oriented X 3.  3. No F.N deficits, ALL C.Nerves Intact, Strength 5/5 all 4 extremities, Sensation intact all 4 extremities, Plantars down going.  4. Ears and Eyes appear Normal, Conjunctivae clear, PERRLA. Moist Oral Mucosa.  5. Supple Neck, No JVD, No cervical lymphadenopathy appriciated, No Carotid Bruits.  6.  Symmetrical Chest wall movement, Good air movement bilaterally, CTAB.  7. RRR, No Gallops, Rubs or Murmurs, No Parasternal Heave.  8. Positive Bowel Sounds, Abdomen Soft, No tenderness, No organomegaly appriciated,No rebound -guarding or rigidity.  9.  No Cyanosis, Normal Skin Turgor, No Skin Rash or Bruise.  10. Good muscle tone,  joints appear normal , no effusions, Normal ROM.  11. No Palpable Lymph Nodes in Neck or Axillae     Data Review:    CBC  Recent Labs Lab 03/30/16 1656  WBC 10.0  HGB 14.6  HCT 42.5  PLT 315  MCV 88.4  MCH 30.4  MCHC 34.4  RDW 12.1   ------------------------------------------------------------------------------------------------------------------  Chemistries   Recent Labs Lab 03/30/16 1656  NA 135  K 4.0  CL 101  CO2 29  GLUCOSE 103*  BUN 15  CREATININE 0.99  CALCIUM 9.1   ------------------------------------------------------------------------------------------------------------------ estimated creatinine clearance is 143.5 mL/min (by C-G formula based on SCr of 0.99 mg/dL). ------------------------------------------------------------------------------------------------------------------ No results for input(s): TSH, T4TOTAL, T3FREE, THYROIDAB in the last 72 hours.  Invalid input(s): FREET3  Coagulation profile No results for input(s): INR, PROTIME in the last 168 hours. ------------------------------------------------------------------------------------------------------------------- No results for input(s): DDIMER in the last 72 hours. -------------------------------------------------------------------------------------------------------------------  Cardiac Enzymes  Recent Labs Lab 03/30/16 1656 03/30/16 2036 03/30/16 2315  TROPONINI <0.03 0.05* 0.07*   ------------------------------------------------------------------------------------------------------------------ No results found for:  BNP   ---------------------------------------------------------------------------------------------------------------  Urinalysis No results found for: COLORURINE, APPEARANCEUR, LABSPEC, PHURINE, GLUCOSEU, HGBUR, BILIRUBINUR, KETONESUR, PROTEINUR, UROBILINOGEN, NITRITE, LEUKOCYTESUR  ----------------------------------------------------------------------------------------------------------------   Imaging Results:    Dg Chest 2 View  Result Date: 03/30/2016 CLINICAL DATA:  Cough for 1 week.  Congestion. EXAM: CHEST  2 VIEW COMPARISON:  None. FINDINGS: Lungs are clear. Heart size and pulmonary vascularity are normal. No adenopathy. No bone lesions. IMPRESSION: No edema or consolidation. Electronically Signed   By: Bretta BangWilliam  Woodruff III M.D.   On: 03/30/2016 16:51    Assessment & Plan:    Principal Problem:   Chest pain Active Problems:   Elevated troponin level    1.  Cp Tele Trop I q6h x3 Cardiac echo Appreciate cardiology consult Defer to cardiology regarding stress testing vs cath Check hga1c, lipid Start aspirin, lipitor, no b blocker due  to low hr.   DVT Prophylaxis Lovenox - SCDs   AM Labs Ordered, also please review Full Orders  Family Communication: Admission, patients condition and plan of care including tests being ordered have been discussed with the patient  who indicate understanding and agree with the plan and Code Status.  Code Status FULL CODE  Likely DC to  home  Condition GUARDED    Consults called: cardiology by ED  Admission status: observation  Time spent in minutes : 45 minutes   Pearson Grippe M.D on 03/31/2016 at 1:14 AM  Between 7am to 7pm - Pager - [phone]. After 7pm go to [email or web address] - password [facility]  [facility] - Office  [phone]

## 2016-03-31 NOTE — Progress Notes (Signed)
No stepdown or Tele at [facility] per flow manager and ED staff, unable to do transfer to ED per ED RN.  We will admit pt to floor and try to transfer in AM, if cardiology not available please send to [facility] in AM when beds are available

## 2016-04-01 LAB — HEMOGLOBIN A1C
HEMOGLOBIN A1C: 5.4 % (ref 4.8–5.6)
MEAN PLASMA GLUCOSE: 108 mg/dL

## 2016-04-05 DIAGNOSIS — R079 Chest pain, unspecified: Secondary | ICD-10-CM | POA: Diagnosis not present

## 2016-04-05 DIAGNOSIS — I10 Essential (primary) hypertension: Secondary | ICD-10-CM | POA: Diagnosis not present

## 2016-04-09 DIAGNOSIS — Z3141 Encounter for fertility testing: Secondary | ICD-10-CM | POA: Diagnosis not present

## 2016-04-22 ENCOUNTER — Ambulatory Visit (INDEPENDENT_AMBULATORY_CARE_PROVIDER_SITE_OTHER): Payer: 59 | Admitting: Physician Assistant

## 2016-04-22 ENCOUNTER — Encounter: Payer: Self-pay | Admitting: Physician Assistant

## 2016-04-22 VITALS — BP 122/90 | HR 66 | Ht 74.0 in | Wt 269.0 lb

## 2016-04-22 DIAGNOSIS — R748 Abnormal levels of other serum enzymes: Secondary | ICD-10-CM | POA: Diagnosis not present

## 2016-04-22 DIAGNOSIS — R778 Other specified abnormalities of plasma proteins: Secondary | ICD-10-CM

## 2016-04-22 DIAGNOSIS — I77810 Thoracic aortic ectasia: Secondary | ICD-10-CM

## 2016-04-22 DIAGNOSIS — I10 Essential (primary) hypertension: Secondary | ICD-10-CM

## 2016-04-22 DIAGNOSIS — R0789 Other chest pain: Secondary | ICD-10-CM

## 2016-04-22 DIAGNOSIS — R7989 Other specified abnormal findings of blood chemistry: Secondary | ICD-10-CM

## 2016-04-22 NOTE — Patient Instructions (Addendum)
Medication Instructions:  Your physician recommends that you continue on your current medications as directed. Please refer to the Current Medication list given to you today.  Labwork: NONE  Testing/Procedures: 1. Your physician has requested that you have an exercise tolerance test. For further information please visit [email or web address]. Please also follow instruction sheet, as given.  2. Your physician has requested that you have an echocardiogram. THIS IS TO BE DONE IN 03/2017. Echocardiography is a painless test that uses sound waves to create images of your heart. It provides your doctor with information about the size and shape of your heart and how well your heart's chambers and valves are working. This procedure takes approximately one hour. There are no restrictions for this procedure.  Follow-Up: AS NEEDED AT THIS TIME   Any Other Special Instructions Will Be Listed Below (If Applicable).  If you need a refill on your cardiac medications before your next appointment, please call your pharmacy.

## 2016-04-22 NOTE — Progress Notes (Signed)
Cardiology Office Note:    Date:  04/22/2016   ID:  Daniel FlorenceJames D Lampe, DOB 09/14/1980, MRN [id number]  PCP:  Carylon PerchesFAGAN,ROY, MD  Cardiologist:  New  Electrophysiologist:  n/a  Referring MD: Carylon PerchesFagan, Roy, MD   Chief Complaint  Patient presents with  . Chest Pain    History of Present Illness:    Daniel Matthews is a 36 y.o. male with a hx of HTN.  Admitted to [facility] 1/6-1/7 with left-sided chest discomfort. He had minimally elevated levels without clear trend. Echocardiogram demonstrated normal LV function with normal wall motion. Case was discussed with cardiology and outpatient follow-up was recommended.  He is here alone.  He notes 2 episodes of chest pain prior to going to the ED.  Each occurrence happened shortly after eating.  He was able to go to sleep without difficulty each time.  When he decided to go to the ED, his BP was very high 190/120.  He was taking Mucinex-D and has since stopped taking it.  He also started taking Omeprazole and his PCP increased his Lisinopril to 40 mg QD.  He has not had any further chest pain.  He denies shortness of breath, syncope, orthopnea, PND, edema.  He denies cough, pleuritic chest pain.     PAD Screen 04/22/2016  Previous PAD dx? No  Previous surgical procedure? No  Pain with walking? No  Feet/toe relief with dangling? No  Painful, non-healing ulcers? No  Extremities discolored? No    Prior CV studies that were reviewed today include:    Echo 03/31/16 Mild LVH, EF 55-60, normal wall motion, grade 1 diastolic dysfunction, mildly dilated aortic root (39 mm)  Past Medical History:  Diagnosis Date  . Hypertension     Past Surgical History:  Procedure Laterality Date  . TONSILLECTOMY      Current Medications: Current Meds  Medication Sig  . aspirin EC 81 MG tablet Take 1 tablet (81 mg total) by mouth daily.  Marland Kitchen. Bioflavonoid Products (VITAMIN C) CHEW Chew 1 tablet by mouth daily as needed (for cold prevention).  Marland Kitchen.  lisinopril (PRINIVIL,ZESTRIL) 20 MG tablet Take 20 mg by mouth daily.  . pseudoephedrine-acetaminophen (TYLENOL SINUS) 30-500 MG TABS tablet Take 1-2 tablets by mouth daily as needed (for cold smyptoms).     Allergies:   Patient has no known allergies.   Social History   Social History  . Marital status: Single    Spouse name: N/A  . Number of children: N/A  . Years of education: N/A   Social History Main Topics  . Smoking status: Never Smoker  . Smokeless tobacco: Never Used  . Alcohol use No  . Drug use: No  . Sexual activity: Not Asked   Other Topics Concern  . 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   ROS:   Please see the history of present illness.    Review of Systems  Gastrointestinal: Positive for diarrhea.   All other systems reviewed and are negative.   EKGs/Labs/Other Test Reviewed:    EKG:  EKG is  ordered today.  The ekg ordered today demonstrates NSR, HR 66, rightward axis, QTc 406 ms  Recent Labs: 03/31/2016: ALT 36; BUN 13; Creatinine, Ser 0.82; Hemoglobin 14.4; Platelets 299; Potassium 3.8; Sodium 137   Recent Lipid Panel    Component Value Date/Time   CHOL 185 03/31/2016 0406   TRIG 157 (H) 03/31/2016 0406   HDL 32 (L) 03/31/2016 0406   CHOLHDL 5.8 03/31/2016 0406   VLDL 31 03/31/2016 0406   LDLCALC 122 (H) 03/31/2016 0406    Physical Exam:    VS:  BP 122/90 (BP Location: Right Arm, Patient Position: Sitting, Cuff Size: Large)   Pulse 66   Ht 6\' 2"  (1.88 m)   Wt 269 lb (122 kg)   BMI 34.54 kg/m     Wt Readings from Last 3 Encounters:  04/22/16 269 lb (122 kg)  03/31/16 268 lb 4.8 oz (121.7 kg)     Physical Exam  Constitutional: He is oriented to  person, place, and time. He appears well-developed and well-nourished. No distress.  HENT:  Head: Normocephalic and atraumatic.  Eyes: No scleral icterus.  Neck: No JVD present. Carotid bruit is not present.  Cardiovascular: Normal rate, regular rhythm, S1 normal and S2 normal.  Exam reveals no gallop.   No murmur heard. Pulses:      Dorsalis pedis pulses are 2+ on the right side, and 2+ on the left side.       Posterior tibial pulses are 2+ on the right side, and 2+ on the left side.  Abdominal: Soft. He exhibits no mass. There is no tenderness.  Musculoskeletal: He exhibits no edema.  Neurological: He is alert and oriented to person, place, and time.  Skin: Skin is warm and dry.  Psychiatric: He has a normal mood and affect.    ASSESSMENT:    1. Other chest pain   2. Elevated troponin level   3. Essential hypertension   4. Dilated aortic root (HCC)    PLAN:    In order of problems listed above:  1. Chest pain - His symptoms are atypical for ischemia.  He has RFs of HTN, FHx CAD.  His symptoms seemed to have improved with proton pump inhibitor.  I suspect his chest pain was all related to GERD. But, with his RFs, I have recommended screening for CAD with stress testing.  I reviewed his case with Dr. Dietrich Pates who agreed.   -  Arrange plain ETT  2. Elevated Troponin Level - There was no trend and the Troponin levels were minimally elevated.  His echocardiogram showed normal ejection fraction and no RWMA.  He did record a blood pressure of 190/120 prior to going to the ED.  I suspect his elevated Troponin was demand ischemia related to high blood pressure.  Proceed with ETT as noted.  3. HTN - BP borderline elevated.  He will continue to monitor and follow up with his PCP for aggressive blood pressure management.    4. Dilated Aortic Root - Noted on recent echocardiogram.  Will arrange repeat echocardiogram in 1 year. Continue follow up with PCP for blood pressure management.     Medication Adjustments/Labs and Tests Ordered: Current medicines are reviewed at length with the patient today.  Concerns regarding medicines are outlined above.  Medication changes, Labs and Tests ordered today are outlined in the Patient Instructions noted below. Patient Instructions  Medication Instructions:  Your physician recommends that you continue on your current medications as directed. Please refer to the Current Medication list given to you today.  Labwork: NONE  Testing/Procedures: 1. Your physician has  requested that you have an exercise tolerance test. For further information please visit [email or web address] Please also follow instruction sheet, as given.  2. Your physician has requested that you have an echocardiogram. THIS IS TO BE DONE IN 03/2017. Echocardiography is a painless test that uses sound waves to create images of your heart. It provides your doctor with information about the size and shape of your heart and how well your heart's chambers and valves are working. This procedure takes approximately one hour. There are no restrictions for this procedure.  Follow-Up: AS NEEDED AT THIS TIME   Any Other Special Instructions Will Be Listed Below (If Applicable).  If you need a refill on your cardiac medications before your next appointment, please call your pharmacy.  Signed, Tereso Newcomer, PA-C  04/22/2016 5:25 PM    [facility] [address] [place], [place], [place]  [address] Phone: [phone]; Fax: [phone]

## 2016-05-01 ENCOUNTER — Ambulatory Visit (INDEPENDENT_AMBULATORY_CARE_PROVIDER_SITE_OTHER): Payer: 59

## 2016-05-01 ENCOUNTER — Encounter: Payer: Self-pay | Admitting: Physician Assistant

## 2016-05-01 ENCOUNTER — Telehealth: Payer: Self-pay | Admitting: *Deleted

## 2016-05-01 ENCOUNTER — Encounter (INDEPENDENT_AMBULATORY_CARE_PROVIDER_SITE_OTHER): Payer: Self-pay

## 2016-05-01 DIAGNOSIS — R0789 Other chest pain: Secondary | ICD-10-CM | POA: Diagnosis not present

## 2016-05-01 LAB — EXERCISE TOLERANCE TEST
CHL CUP MPHR: 186 {beats}/min
CHL CUP STRESS STAGE 1 DBP: 84 mmHg
CHL CUP STRESS STAGE 1 GRADE: 0 %
CHL CUP STRESS STAGE 1 SBP: 127 mmHg
CHL CUP STRESS STAGE 2 HR: 95 {beats}/min
CHL CUP STRESS STAGE 3 HR: 96 {beats}/min
CHL CUP STRESS STAGE 3 SPEED: 1 mph
CHL CUP STRESS STAGE 4 DBP: 68 mmHg
CHL CUP STRESS STAGE 4 GRADE: 10 %
CHL CUP STRESS STAGE 4 HR: 112 {beats}/min
CHL CUP STRESS STAGE 4 SPEED: 1.7 mph
CHL CUP STRESS STAGE 5 DBP: 60 mmHg
CHL CUP STRESS STAGE 5 GRADE: 12 %
CHL CUP STRESS STAGE 5 HR: 130 {beats}/min
CHL CUP STRESS STAGE 5 SBP: 177 mmHg
CHL CUP STRESS STAGE 6 SBP: 193 mmHg
CHL CUP STRESS STAGE 6 SPEED: 3.3 mph
CHL CUP STRESS STAGE 7 GRADE: 16 %
CHL CUP STRESS STAGE 8 HR: 137 {beats}/min
CHL CUP STRESS STAGE 8 SBP: 184 mmHg
CHL CUP STRESS STAGE 8 SPEED: 1.5 mph
CHL CUP STRESS STAGE 9 GRADE: 0 %
CHL CUP STRESS STAGE 9 HR: 107 {beats}/min
CHL CUP STRESS STAGE 9 SBP: 149 mmHg
CSEPPHR: 162 {beats}/min
CSEPPMHR: 87 %
Estimated workload: 10.6 METS
Exercise duration (min): 9 min
Exercise duration (sec): 27 s
Percent HR: 87 %
RPE: 16
Rest HR: 75 {beats}/min
Stage 1 HR: 83 {beats}/min
Stage 1 Speed: 0 mph
Stage 2 Grade: 0 %
Stage 2 Speed: 1 mph
Stage 3 Grade: 0 %
Stage 4 SBP: 128 mmHg
Stage 5 Speed: 2.5 mph
Stage 6 DBP: 66 mmHg
Stage 6 Grade: 14 %
Stage 6 HR: 157 {beats}/min
Stage 7 HR: 162 {beats}/min
Stage 7 Speed: 4.2 mph
Stage 8 DBP: 67 mmHg
Stage 8 Grade: 0 %
Stage 9 DBP: 96 mmHg
Stage 9 Speed: 0 mph

## 2016-05-01 NOTE — Telephone Encounter (Signed)
Pt notified of stress test results by phone with verbal understanding.  

## 2016-05-03 MED FILL — LISINOPRIL 40 MG TABLET: 40 | 90 days supply | Qty: 90 | Fill #0

## 2016-05-28 DIAGNOSIS — J029 Acute pharyngitis, unspecified: Secondary | ICD-10-CM | POA: Diagnosis not present

## 2016-06-08 DIAGNOSIS — Z3189 Encounter for other procreative management: Secondary | ICD-10-CM | POA: Diagnosis not present

## 2016-10-24 MED FILL — LISINOPRIL 40 MG TAB: 40 | 90 days supply | Qty: 90 | Fill #1

## 2017-01-21 DIAGNOSIS — Z23 Encounter for immunization: Secondary | ICD-10-CM | POA: Diagnosis not present

## 2017-04-16 DIAGNOSIS — I10 Essential (primary) hypertension: Secondary | ICD-10-CM | POA: Diagnosis not present

## 2017-04-16 DIAGNOSIS — Z6837 Body mass index (BMI) 37.0-37.9, adult: Secondary | ICD-10-CM | POA: Diagnosis not present

## 2017-04-16 DIAGNOSIS — Z23 Encounter for immunization: Secondary | ICD-10-CM | POA: Diagnosis not present

## 2017-04-21 ENCOUNTER — Other Ambulatory Visit (HOSPITAL_COMMUNITY): Payer: 59

## 2017-04-21 ENCOUNTER — Ambulatory Visit (HOSPITAL_COMMUNITY)
Admission: RE | Admit: 2017-04-21 | Discharge: 2017-04-21 | Disposition: A | Discharge status: Home / Self Care | Payer: 59 | Source: Ambulatory Visit | Attending: Physician Assistant | Admitting: Physician Assistant

## 2017-04-21 DIAGNOSIS — I77810 Thoracic aortic ectasia: Secondary | ICD-10-CM | POA: Insufficient documentation

## 2017-05-09 MED FILL — AMLODIPINE BESYLATE 5 MG TA: 5 | 90 days supply | Qty: 90 | Fill #0

## 2017-05-09 MED FILL — LISINOPRIL 40 MG TABLET: 40 | 90 days supply | Qty: 90 | Fill #0

## 2017-06-16 DIAGNOSIS — I10 Essential (primary) hypertension: Secondary | ICD-10-CM | POA: Diagnosis not present

## 2017-06-16 DIAGNOSIS — Z6839 Body mass index (BMI) 39.0-39.9, adult: Secondary | ICD-10-CM | POA: Diagnosis not present

## 2017-07-03 MED FILL — GATIFLOXACIN 0.5% EYE DROPS: 0.5 | 8 days supply | Qty: 3 | Fill #0

## 2017-08-21 MED FILL — ZOLPIDEM TARTRATE 10 MG TAB: 10 | 5 days supply | Qty: 5 | Fill #0

## 2017-09-10 MED FILL — AMLODIPINE BESYLATE 5 MG TA: 5 | 90 days supply | Qty: 90 | Fill #1

## 2017-09-10 MED FILL — LISINOPRIL 40 MG TABLET: 40 | 90 days supply | Qty: 90 | Fill #1

## 2017-10-13 DIAGNOSIS — I10 Essential (primary) hypertension: Secondary | ICD-10-CM | POA: Diagnosis not present

## 2017-12-04 MED FILL — AMLODIPINE BESYLATE 10 MG T: 10 | 90 days supply | Qty: 90 | Fill #0

## 2017-12-08 DIAGNOSIS — Z79899 Other long term (current) drug therapy: Secondary | ICD-10-CM | POA: Diagnosis not present

## 2017-12-08 DIAGNOSIS — I10 Essential (primary) hypertension: Secondary | ICD-10-CM | POA: Diagnosis not present

## 2017-12-16 DIAGNOSIS — I10 Essential (primary) hypertension: Secondary | ICD-10-CM | POA: Diagnosis not present

## 2017-12-16 DIAGNOSIS — R945 Abnormal results of liver function studies: Secondary | ICD-10-CM | POA: Diagnosis not present

## 2017-12-16 DIAGNOSIS — Z23 Encounter for immunization: Secondary | ICD-10-CM | POA: Diagnosis not present

## 2017-12-16 MED FILL — HYDROCHLOROTHIAZIDE 12.5 MG: 12.5 | 90 days supply | Qty: 90 | Fill #0

## 2018-01-13 DIAGNOSIS — I10 Essential (primary) hypertension: Secondary | ICD-10-CM | POA: Diagnosis not present

## 2018-01-13 DIAGNOSIS — E669 Obesity, unspecified: Secondary | ICD-10-CM | POA: Diagnosis not present

## 2018-01-13 DIAGNOSIS — E559 Vitamin D deficiency, unspecified: Secondary | ICD-10-CM | POA: Diagnosis not present

## 2018-01-13 DIAGNOSIS — R739 Hyperglycemia, unspecified: Secondary | ICD-10-CM | POA: Diagnosis not present

## 2018-01-13 DIAGNOSIS — R5383 Other fatigue: Secondary | ICD-10-CM | POA: Diagnosis not present

## 2018-02-11 DIAGNOSIS — R220 Localized swelling, mass and lump, head: Secondary | ICD-10-CM | POA: Diagnosis not present

## 2018-02-11 DIAGNOSIS — R21 Rash and other nonspecific skin eruption: Secondary | ICD-10-CM | POA: Diagnosis not present

## 2018-02-18 DIAGNOSIS — I10 Essential (primary) hypertension: Secondary | ICD-10-CM | POA: Diagnosis not present

## 2018-02-18 DIAGNOSIS — E559 Vitamin D deficiency, unspecified: Secondary | ICD-10-CM | POA: Diagnosis not present

## 2018-02-18 DIAGNOSIS — Z713 Dietary counseling and surveillance: Secondary | ICD-10-CM | POA: Diagnosis not present

## 2018-02-18 DIAGNOSIS — E669 Obesity, unspecified: Secondary | ICD-10-CM | POA: Diagnosis not present

## 2018-02-18 MED FILL — LISINOPRIL 40 MG TABLET: 40 | 90 days supply | Qty: 90 | Fill #0

## 2018-04-23 DIAGNOSIS — Z713 Dietary counseling and surveillance: Secondary | ICD-10-CM | POA: Diagnosis not present

## 2018-04-23 DIAGNOSIS — I10 Essential (primary) hypertension: Secondary | ICD-10-CM | POA: Diagnosis not present

## 2018-04-23 DIAGNOSIS — E559 Vitamin D deficiency, unspecified: Secondary | ICD-10-CM | POA: Diagnosis not present

## 2018-04-23 DIAGNOSIS — E669 Obesity, unspecified: Secondary | ICD-10-CM | POA: Diagnosis not present

## 2018-04-23 MED FILL — AMLODIPINE BESYLATE 10 MG T: 10 | 90 days supply | Qty: 90 | Fill #0

## 2018-06-12 MED FILL — LISINOPRIL 40 MG TABLET: 40 | 90 days supply | Qty: 90 | Fill #1 | Status: TO

## 2018-07-21 ENCOUNTER — Ambulatory Visit (INDEPENDENT_AMBULATORY_CARE_PROVIDER_SITE_OTHER): Payer: 59 | Admitting: Internal Medicine

## 2018-07-21 DIAGNOSIS — I10 Essential (primary) hypertension: Secondary | ICD-10-CM | POA: Diagnosis not present

## 2018-07-21 DIAGNOSIS — E559 Vitamin D deficiency, unspecified: Secondary | ICD-10-CM | POA: Diagnosis not present

## 2018-07-21 DIAGNOSIS — E669 Obesity, unspecified: Secondary | ICD-10-CM | POA: Diagnosis not present

## 2018-08-03 IMAGING — DX DG CHEST 2V
2 series · 2 of 2 positions shown · non-contrast
Comparison: None.

CLINICAL DATA: Cough for 1 week.  Congestion.

EXAM:
CHEST  2 VIEW

[chest pa]
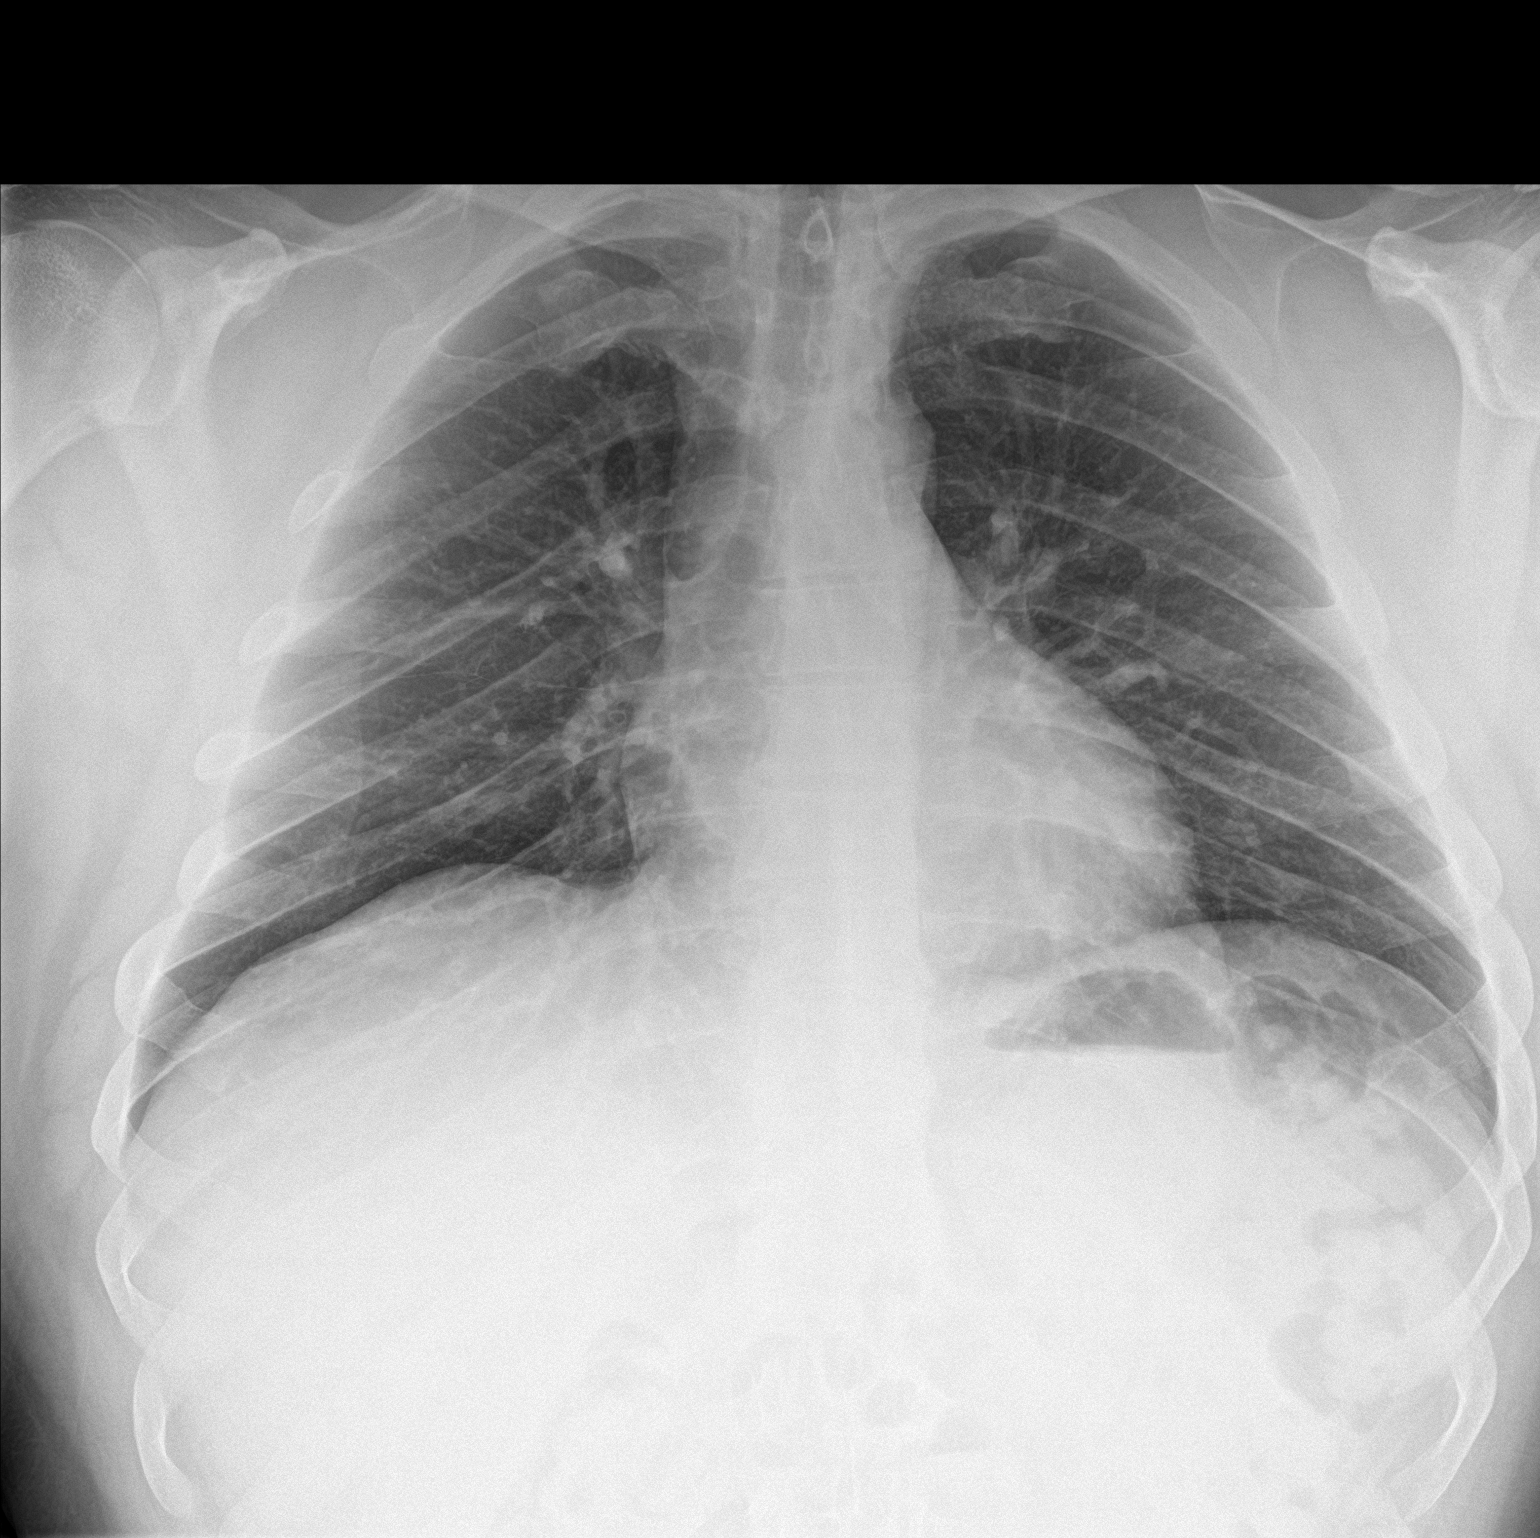

[chest lat]
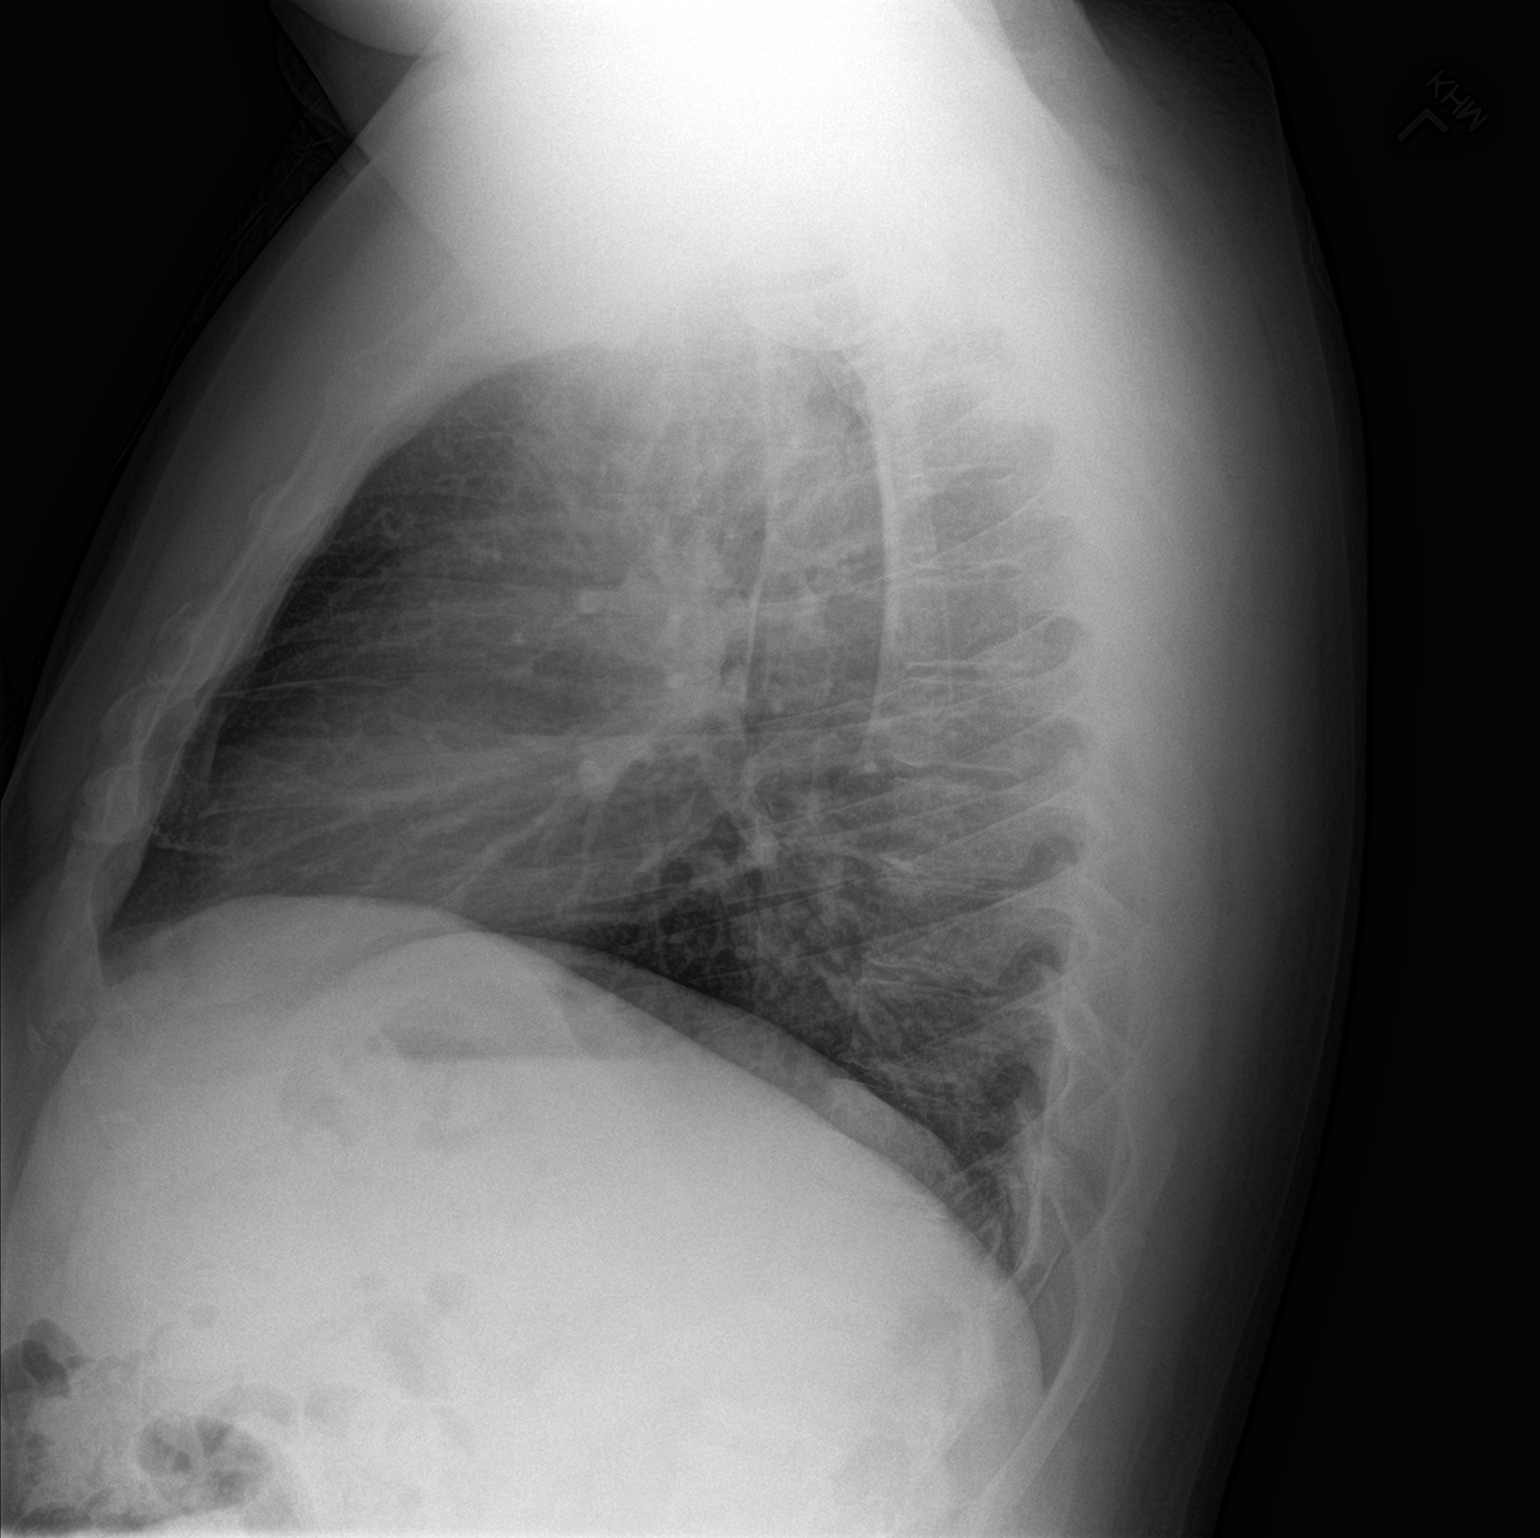

[2 of 2 positions shown; findings below may reference images not displayed]

FINDINGS: Lungs are clear. Heart size and pulmonary vascularity are normal. No
adenopathy. No bone lesions.
IMPRESSION: No edema or consolidation.

## 2018-11-26 ENCOUNTER — Ambulatory Visit (INDEPENDENT_AMBULATORY_CARE_PROVIDER_SITE_OTHER): Payer: Self-pay | Admitting: Internal Medicine

## 2018-11-26 ENCOUNTER — Other Ambulatory Visit: Payer: Self-pay

## 2018-12-03 ENCOUNTER — Other Ambulatory Visit: Payer: Self-pay | Admitting: *Deleted

## 2018-12-03 DIAGNOSIS — Z20822 Contact with and (suspected) exposure to covid-19: Secondary | ICD-10-CM

## 2018-12-03 DIAGNOSIS — R6889 Other general symptoms and signs: Secondary | ICD-10-CM | POA: Diagnosis not present

## 2018-12-04 LAB — NOVEL CORONAVIRUS, NAA: SARS-CoV-2, NAA: NOT DETECTED

## 2019-02-24 ENCOUNTER — Other Ambulatory Visit (INDEPENDENT_AMBULATORY_CARE_PROVIDER_SITE_OTHER): Payer: Self-pay | Admitting: Internal Medicine

## 2019-04-26 ENCOUNTER — Telehealth (INDEPENDENT_AMBULATORY_CARE_PROVIDER_SITE_OTHER): Payer: Self-pay | Admitting: Nurse Practitioner

## 2019-04-26 ENCOUNTER — Other Ambulatory Visit (INDEPENDENT_AMBULATORY_CARE_PROVIDER_SITE_OTHER): Payer: Self-pay | Admitting: Internal Medicine

## 2019-04-26 NOTE — Telephone Encounter (Signed)
Please call this patient and notify him that I have refilled his blood pressure medication for a 30-day supply.  He has not been seen in this office for quite some time, thus he will need a follow-up appointment with either myself or Dr. Karilyn Cota.  It should be face-to-face if possible because he will be due for blood work.  Please try to get him scheduled within the next month with either myself or Dr. Karilyn Cota.

## 2019-04-26 NOTE — Telephone Encounter (Signed)
Will prescribe 30-day supply of each.  Patient needs a follow-up appointment before receiving additional refills.

## 2019-04-27 NOTE — Telephone Encounter (Signed)
Will be in thurs at 3pm to see you.

## 2019-04-29 ENCOUNTER — Ambulatory Visit (INDEPENDENT_AMBULATORY_CARE_PROVIDER_SITE_OTHER): Payer: 59 | Admitting: Nurse Practitioner

## 2019-05-13 ENCOUNTER — Ambulatory Visit (INDEPENDENT_AMBULATORY_CARE_PROVIDER_SITE_OTHER): Payer: 59 | Admitting: Nurse Practitioner

## 2019-05-27 ENCOUNTER — Other Ambulatory Visit: Payer: Self-pay

## 2019-05-27 ENCOUNTER — Ambulatory Visit (INDEPENDENT_AMBULATORY_CARE_PROVIDER_SITE_OTHER): Payer: 59 | Admitting: Nurse Practitioner

## 2019-05-27 ENCOUNTER — Encounter (INDEPENDENT_AMBULATORY_CARE_PROVIDER_SITE_OTHER): Payer: Self-pay | Admitting: Nurse Practitioner

## 2019-05-27 VITALS — BP 130/88 | HR 67 | Temp 97.6°F | Ht 74.0 in | Wt 277.6 lb

## 2019-05-27 DIAGNOSIS — Z13220 Encounter for screening for lipoid disorders: Secondary | ICD-10-CM | POA: Diagnosis not present

## 2019-05-27 DIAGNOSIS — I10 Essential (primary) hypertension: Secondary | ICD-10-CM | POA: Diagnosis not present

## 2019-05-27 DIAGNOSIS — E559 Vitamin D deficiency, unspecified: Secondary | ICD-10-CM

## 2019-05-27 DIAGNOSIS — Z0001 Encounter for general adult medical examination with abnormal findings: Secondary | ICD-10-CM | POA: Diagnosis not present

## 2019-05-27 DIAGNOSIS — Z139 Encounter for screening, unspecified: Secondary | ICD-10-CM | POA: Insufficient documentation

## 2019-05-27 DIAGNOSIS — Z131 Encounter for screening for diabetes mellitus: Secondary | ICD-10-CM | POA: Diagnosis not present

## 2019-05-27 NOTE — Progress Notes (Signed)
Subjective:  Patient ID: Daniel Matthews, male    DOB: Dec 14, 1980  Age: 39 y.o. MRN: [id number]  CC:  Chief Complaint  Patient presents with  . Hypertension  . Follow-up    Vitamin D deficiency, obesity      HPI  This patient comes in today for the above.  Hypertension: Patient has a history of hypertension.  He continues on amlodipine 10 mg daily and lisinopril 40 mg daily.  Vitamin D deficiency: He also has history of vitamin D deficiency.  He tells me he currently is taking 10,000 IUs of vitamin D3 daily, but for a while stop this medication and just recently restarted it.  Obesity: He does have a history of morbid obesity due to his comorbidity of hypertension.  In the past he has participated in intermittent fasting with good results, however recently he has not been participating in fasting as regularly.  He does tell me he will sometimes do a 12-hour fast and tolerates this well.  He is interested in trying to lose weight.  He has been trying to make changes to his diet as well recently.  He has also been starting to exercise at the gym.   Past Medical History:  Diagnosis Date  . History of exercise stress test    ETT 2/18: Ex 9'27"; no ST changes; BP response was normal  . Hypertension       Family History  Problem Relation Age of Onset  . Hypertension Mother   . CAD Father 25       CABG  . Heart attack Maternal Grandfather   . 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 Smoking status: Never Smoker  . Smokeless tobacco: Never Used  Substance Use Topics  . Alcohol use: No     Current Meds  Medication Sig  . amLODipine (NORVASC) 10 MG tablet TAKE 1 TABLET BY MOUTH EVERY DAY  . Cholecalciferol 1.25 MG (50000 UT) TABS Take 2 tablets by mouth.  Marland Kitchen lisinopril (ZESTRIL) 40 MG  tablet TAKE 1 TABLET BY MOUTH EVERY DAY  . [DISCONTINUED] calcium-vitamin D (OSCAL WITH D) 500-200 MG-UNIT tablet Take 2 tablets by mouth daily with breakfast.     ROS:  Review of Systems  Constitutional: Negative for fever, malaise/fatigue and weight loss.  Eyes: Negative for blurred vision and double vision.  Respiratory: Negative for cough, shortness of breath and wheezing.   Cardiovascular: Negative for chest pain and palpitations.  Genitourinary: Negative for dysuria and hematuria.  Neurological: Negative for dizziness and headaches.     Objective:   Today's Vitals: BP 130/88 (BP Location: Left Arm, Patient Position: Sitting, Cuff Size: Normal)   Pulse 67   Temp 97.6 F (36.4 C) (Temporal)   Ht '6\' 2"'  (1.88 m)   Wt 277 lb 9.6 oz (125.9 kg)   SpO2 97%   BMI 35.64 kg/m  Vitals with BMI 05/27/2019 04/22/2016 04/22/2016  Height '6\' 2"'  - '6\' 2"'   Weight 277 lbs 10 oz - 269 lbs  BMI 85.46 - 27.0  Systolic 350 093 818  Diastolic 88 90 299  Pulse 67 66 66     Physical Exam Vitals reviewed.  Constitutional:      Appearance: Normal appearance.  HENT:  Head: Normocephalic and atraumatic.  Cardiovascular:     Rate and Rhythm: Normal rate and regular rhythm.  Pulmonary:     Effort: Pulmonary effort is normal.     Breath sounds: Normal breath sounds.  Musculoskeletal:     Cervical back: Neck supple.  Skin:    General: Skin is warm and dry.  Neurological:     Mental Status: He is alert and oriented to person, place, and time.  Psychiatric:        Mood and Affect: Mood normal.        Behavior: Behavior normal.        Thought Content: Thought content normal.        Judgment: Judgment normal.          Assessment   1. Encounter for general adult medical examination with abnormal findings   2. Screening for diabetes mellitus   3. Screening, lipid   4. Vitamin D deficiency   5. Hypertension, unspecified type   6. Morbid obesity ([place])   7. Screening for condition       Tests ordered Orders Placed This Encounter  Procedures  . CBC  . CMP with eGFR([facility])  . Vitamin D, 25-hydroxy  . TSH  . Lipid Panel  . Hemoglobin A1c     Plan: 1.,  2.,  3.,  4.,  7.  He is due for his annual physical exam, thus I will collect all blood work today in preparation for his upcoming annual physical exam.  5.,  6.  He will continue on his current medication regimen for now.  I have encouraged him to start participating in intermittent fasting again as he is able to tolerate.  I will also give him a handout with recommendations regarding this.  No orders of the defined types were placed in this encounter.   Patient to follow-up in 4 to 6 weeks for his annual physical exam.  Ailene Ards, NP

## 2019-05-27 NOTE — Patient Instructions (Signed)
[facility] Dietary Recommendations for Weight Loss What to Avoid . Avoid added sugars o Often added sugar can be found in processed foods such as many condiments, dry cereals, cakes, cookies, chips, crisps, crackers, candies, sweetened drinks, etc.  o Read labels and AVOID/DECREASE use of foods with the following in their ingredient list: Sugar, fructose, high fructose corn syrup, sucrose, glucose, maltose, dextrose, molasses, cane sugar, brown sugar, any type of syrup, agave nectar, etc.   . Avoid snacking in between meals . Avoid foods made with flour o If you are going to eat food made with flour, choose those made with whole-grains; and, minimize your consumption as much as is tolerable . Avoid processed foods o These foods are generally stocked in the middle of the grocery store. Focus on shopping on the perimeter of the grocery.  . Avoid Meat  o We recommend following a plant-based diet at [facility]. Thus, we recommend avoiding meat as a general rule. Consider eating beans, legumes, eggs, and/or dairy products for regular protein sources o If you plan on eating meat limit to 4 ounces of meat at a time and choose lean options such as Fish, chicken, turkey. Avoid red meat intake such as pork and/or steak What to Include . Vegetables o GREEN LEAFY VEGETABLES: Kale, spinach, mustard greens, collard greens, cabbage, broccoli, etc. o OTHER: Asparagus, cauliflower, eggplant, carrots, peas, Brussel sprouts, tomatoes, bell peppers, zucchini, beets, cucumbers, etc. . Grains, seeds, and legumes o Beans: kidney beans, black eyed peas, garbanzo beans, black beans, pinto beans, etc. o Whole, unrefined grains: brown rice, barley, bulgur, oatmeal, etc. . Healthy fats  o Avoid highly processed fats such as vegetable oil o Examples of healthy fats: avocado, olives, virgin olive oil, dark chocolate (?72% Cocoa), nuts (peanuts, almonds, walnuts, cashews, pecans, etc.) . None to Low  Intake of Animal Sources of Protein o Meat sources: chicken, turkey, salmon, tuna. Limit to 4 ounces of meat at one time. o Consider limiting dairy sources, but when choosing dairy focus on: PLAIN Greek yogurt, cottage cheese, high-protein milk . Fruit o Choose berries  When to Eat . Intermittent Fasting: o Choosing not to eat for a specific time period, but DO FOCUS ON HYDRATION when fasting o Multiple Techniques: - Time Restricted Eating: eat 3 meals in a day, each meal lasting no more than 60 minutes, no snacks between meals - 16-18 hour fast: fast for 16 to 18 hours up to 7 days a week. Often suggested to start with 2-3 nonconsecutive days per week.  . Remember the time you sleep is counted as fasting.  . Examples of eating schedule: Fast from 7:00pm-11:00am. Eat between 11:00am-7:00pm.  - 24-hour fast: fast for 24 hours up to every other day. Often suggested to start with 1 day per week . Remember the time you sleep is counted as fasting . Examples of eating schedule:  o Eating day: eat 2-3 meals on your eating day. If doing 2 meals, each meal should last no more than 90 minutes. If doing 3 meals, each meal should last no more than 60 minutes. Finish last meal by 7:00pm. o Fasting day: Fast until 7:00pm.  o IF YOU FEEL UNWELL FOR ANY REASON/IN ANY WAY WHEN FASTING, STOP FASTING BY EATING A NUTRITIOUS SNACK OR LIGHT MEAL o ALWAYS FOCUS ON HYDRATION DURING FASTS - Acceptable Hydration sources: water, broths, tea/coffee (black tea/coffee is best but using a small amount of whole-fat dairy products in coffee/tea is acceptable).  -   Poor Hydration Sources: anything with sugar or artificial sweeteners added to it  These recommendations have been developed for patients that are actively receiving medical care from either Dr. Gosrani or Federico Maiorino, DNP, NP-C at [facility]. These recommendations are developed for patients with specific medical conditions and are not meant to be  distributed or used by others that are not actively receiving care from either provider listed above at [facility]. It is not appropriate to participate in the above eating plans without proper medical supervision.   Reference: Fung, J. The obesity code. [place]: [place]; 2016.   

## 2019-05-28 ENCOUNTER — Encounter (INDEPENDENT_AMBULATORY_CARE_PROVIDER_SITE_OTHER): Payer: Self-pay | Admitting: Nurse Practitioner

## 2019-05-28 LAB — LIPID PANEL
Cholesterol: 211 mg/dL — ABNORMAL HIGH (ref <200)
HDL: 31 mg/dL — ABNORMAL LOW (ref >=40)
LDL Cholesterol (Calc): 150 mg/dL (calc) — ABNORMAL HIGH
Non-HDL Cholesterol (Calc): 180 mg/dL (calc) — ABNORMAL HIGH (ref <130)
Total CHOL/HDL Ratio: 6.8 (calc) — ABNORMAL HIGH (ref <5.0)
Triglycerides: 161 mg/dL — ABNORMAL HIGH (ref <150)

## 2019-05-28 LAB — COMPLETE METABOLIC PANEL WITH GFR
AG Ratio: 1.9 (calc) (ref 1.0–2.5)
ALT: 40 U/L (ref 9–46)
AST: 23 U/L (ref 10–40)
Albumin: 4.4 g/dL (ref 3.6–5.1)
Alkaline phosphatase (APISO): 53 U/L (ref 36–130)
BUN: 10 mg/dL (ref 7–25)
CO2: 30 mmol/L (ref 20–32)
Calcium: 9.7 mg/dL (ref 8.6–10.3)
Chloride: 102 mmol/L (ref 98–110)
Creat: 0.91 mg/dL (ref 0.60–1.35)
GFR, Est African American: 124 mL/min/{1.73_m2} (ref >=60)
GFR, Est Non African American: 107 mL/min/{1.73_m2} (ref >=60)
Globulin: 2.3 g/dL (calc) (ref 1.9–3.7)
Glucose, Bld: 97 mg/dL (ref 65–99)
Potassium: 4.8 mmol/L (ref 3.5–5.3)
Sodium: 137 mmol/L (ref 135–146)
Total Bilirubin: 0.9 mg/dL (ref 0.2–1.2)
Total Protein: 6.7 g/dL (ref 6.1–8.1)

## 2019-05-28 LAB — CBC
HCT: 42.9 % (ref 38.5–50.0)
Hemoglobin: 14.6 g/dL (ref 13.2–17.1)
MCH: 30.4 pg (ref 27.0–33.0)
MCHC: 34 g/dL (ref 32.0–36.0)
MCV: 89.4 fL (ref 80.0–100.0)
MPV: 9.8 fL (ref 7.5–12.5)
Platelets: 333 10*3/uL (ref 140–400)
RBC: 4.8 10*6/uL (ref 4.20–5.80)
RDW: 12.3 % (ref 11.0–15.0)
WBC: 7.5 10*3/uL (ref 3.8–10.8)

## 2019-05-28 LAB — TSH: TSH: 1.72 mIU/L (ref 0.40–4.50)

## 2019-05-28 LAB — HEMOGLOBIN A1C
Hgb A1c MFr Bld: 5.3 % of total Hgb (ref <5.7)
Mean Plasma Glucose: 105 (calc)
eAG (mmol/L): 5.8 (calc)

## 2019-05-28 LAB — VITAMIN D 25 HYDROXY (VIT D DEFICIENCY, FRACTURES): Vit D, 25-Hydroxy: 23 ng/mL — ABNORMAL LOW (ref 30–100)

## 2019-06-24 ENCOUNTER — Encounter (INDEPENDENT_AMBULATORY_CARE_PROVIDER_SITE_OTHER): Payer: 59 | Admitting: Internal Medicine

## 2019-07-21 ENCOUNTER — Telehealth (INDEPENDENT_AMBULATORY_CARE_PROVIDER_SITE_OTHER): Payer: Self-pay | Admitting: Internal Medicine

## 2019-07-21 ENCOUNTER — Other Ambulatory Visit (INDEPENDENT_AMBULATORY_CARE_PROVIDER_SITE_OTHER): Payer: Self-pay | Admitting: Internal Medicine

## 2019-07-21 MED ORDER — LISINOPRIL 40 MG PO TABS: 40.0000 mg | ORAL_TABLET | Freq: Every day | ORAL | 1 refills | Status: DC

## 2019-07-21 MED ORDER — AMLODIPINE BESYLATE 10 MG PO TABS: 10.0000 mg | ORAL_TABLET | Freq: Every day | ORAL | 1 refills | Status: DC

## 2019-07-21 MED FILL — LISINOPRIL 40 MG TABS: 40 | 30 days supply | Qty: 30 | Fill #0

## 2019-07-21 MED FILL — AMLODIPINE BESYLATE 10 MG T: 10 | 30 days supply | Qty: 30 | Fill #0

## 2019-07-21 NOTE — Telephone Encounter (Signed)
Please let him know that I have sent both his blood pressure medications to [facility] pharmacy which should last him until his next appointment in May.

## 2019-07-22 ENCOUNTER — Other Ambulatory Visit (INDEPENDENT_AMBULATORY_CARE_PROVIDER_SITE_OTHER): Payer: Self-pay | Admitting: Internal Medicine

## 2019-07-22 ENCOUNTER — Encounter (INDEPENDENT_AMBULATORY_CARE_PROVIDER_SITE_OTHER): Payer: POS | Admitting: Internal Medicine

## 2019-07-22 MED ORDER — LISINOPRIL 40 MG PO TABS: 40.0000 mg | ORAL_TABLET | Freq: Every day | ORAL | 1 refills | Status: DC

## 2019-07-22 MED ORDER — AMLODIPINE BESYLATE 10 MG PO TABS: 10.0000 mg | ORAL_TABLET | Freq: Every day | ORAL | 1 refills | Status: DC

## 2019-07-22 NOTE — Telephone Encounter (Signed)
I have resent both medications to [facility] on [address].

## 2019-07-22 NOTE — Telephone Encounter (Signed)
Done

## 2019-08-18 ENCOUNTER — Encounter (INDEPENDENT_AMBULATORY_CARE_PROVIDER_SITE_OTHER): Payer: Self-pay | Admitting: Nurse Practitioner

## 2019-08-18 ENCOUNTER — Other Ambulatory Visit: Payer: Self-pay

## 2019-08-18 ENCOUNTER — Ambulatory Visit (INDEPENDENT_AMBULATORY_CARE_PROVIDER_SITE_OTHER): Payer: Managed Care, Other (non HMO) | Admitting: Nurse Practitioner

## 2019-08-18 VITALS — BP 140/90 | HR 88 | Temp 98.5°F | Ht 74.0 in | Wt 274.6 lb

## 2019-08-18 DIAGNOSIS — I10 Essential (primary) hypertension: Secondary | ICD-10-CM

## 2019-08-18 DIAGNOSIS — Z0001 Encounter for general adult medical examination with abnormal findings: Secondary | ICD-10-CM | POA: Diagnosis not present

## 2019-08-18 DIAGNOSIS — E559 Vitamin D deficiency, unspecified: Secondary | ICD-10-CM | POA: Diagnosis not present

## 2019-08-18 DIAGNOSIS — E782 Mixed hyperlipidemia: Secondary | ICD-10-CM

## 2019-08-18 DIAGNOSIS — H1013 Acute atopic conjunctivitis, bilateral: Secondary | ICD-10-CM

## 2019-08-18 MED ORDER — AZELASTINE HCL 0.05 % OP SOLN: 1.0000 [drp] | Freq: Two times a day (BID) | OPHTHALMIC | 3 refills | Status: AC | PRN

## 2019-08-18 NOTE — Patient Instructions (Signed)
[facility] Dietary Recommendations for Weight Loss What to Avoid . Avoid added sugars o Often added sugar can be found in processed foods such as many condiments, dry cereals, cakes, cookies, chips, crisps, crackers, candies, sweetened drinks, etc.  o Read labels and AVOID/DECREASE use of foods with the following in their ingredient list: Sugar, fructose, high fructose corn syrup, sucrose, glucose, maltose, dextrose, molasses, cane sugar, brown sugar, any type of syrup, agave nectar, etc.   . Avoid snacking in between meals . Avoid foods made with flour o If you are going to eat food made with flour, choose those made with whole-grains; and, minimize your consumption as much as is tolerable . Avoid processed foods o These foods are generally stocked in the middle of the grocery store. Focus on shopping on the perimeter of the grocery.  . Avoid Meat  o We recommend following a plant-based diet at [facility]. Thus, we recommend avoiding meat as a general rule. Consider eating beans, legumes, eggs, and/or dairy products for regular protein sources o If you plan on eating meat limit to 4 ounces of meat at a time and choose lean options such as Fish, chicken, turkey. Avoid red meat intake such as pork and/or steak What to Include . Vegetables o GREEN LEAFY VEGETABLES: Kale, spinach, mustard greens, collard greens, cabbage, broccoli, etc. o OTHER: Asparagus, cauliflower, eggplant, carrots, peas, Brussel sprouts, tomatoes, bell peppers, zucchini, beets, cucumbers, etc. . Grains, seeds, and legumes o Beans: kidney beans, black eyed peas, garbanzo beans, black beans, pinto beans, etc. o Whole, unrefined grains: brown rice, barley, bulgur, oatmeal, etc. . Healthy fats  o Avoid highly processed fats such as vegetable oil o Examples of healthy fats: avocado, olives, virgin olive oil, dark chocolate (?72% Cocoa), nuts (peanuts, almonds, walnuts, cashews, pecans, etc.) . None to Low  Intake of Animal Sources of Protein o Meat sources: chicken, turkey, salmon, tuna. Limit to 4 ounces of meat at one time. o Consider limiting dairy sources, but when choosing dairy focus on: PLAIN Greek yogurt, cottage cheese, high-protein milk . Fruit o Choose berries  When to Eat . Intermittent Fasting: o Choosing not to eat for a specific time period, but DO FOCUS ON HYDRATION when fasting o Multiple Techniques: - Time Restricted Eating: eat 3 meals in a day, each meal lasting no more than 60 minutes, no snacks between meals - 16-18 hour fast: fast for 16 to 18 hours up to 7 days a week. Often suggested to start with 2-3 nonconsecutive days per week.  . Remember the time you sleep is counted as fasting.  . Examples of eating schedule: Fast from 7:00pm-11:00am. Eat between 11:00am-7:00pm.  - 24-hour fast: fast for 24 hours up to every other day. Often suggested to start with 1 day per week . Remember the time you sleep is counted as fasting . Examples of eating schedule:  o Eating day: eat 2-3 meals on your eating day. If doing 2 meals, each meal should last no more than 90 minutes. If doing 3 meals, each meal should last no more than 60 minutes. Finish last meal by 7:00pm. o Fasting day: Fast until 7:00pm.  o IF YOU FEEL UNWELL FOR ANY REASON/IN ANY WAY WHEN FASTING, STOP FASTING BY EATING A NUTRITIOUS SNACK OR LIGHT MEAL o ALWAYS FOCUS ON HYDRATION DURING FASTS - Acceptable Hydration sources: water, broths, tea/coffee (black tea/coffee is best but using a small amount of whole-fat dairy products in coffee/tea is acceptable).  -   Poor Hydration Sources: anything with sugar or artificial sweeteners added to it  These recommendations have been developed for patients that are actively receiving medical care from either Dr. Gosrani or Daziya Redmond, DNP, NP-C at [facility]. These recommendations are developed for patients with specific medical conditions and are not meant to be  distributed or used by others that are not actively receiving care from either provider listed above at [facility]. It is not appropriate to participate in the above eating plans without proper medical supervision.   Reference: Fung, J. The obesity code. [place]: [place]; 2016.   

## 2019-08-18 NOTE — Progress Notes (Signed)
Subjective:  Patient ID: Daniel Matthews, male    DOB: 1980-06-06  Age: 39 y.o. MRN: [id number]  CC:  Chief Complaint  Patient presents with  . Annual Exam      HPI  This patient arrives today for the above.  He is here to have his annual physical exam completed today.  Blood work was collected at his last office visit and we will be addressing this today.  Blood work at last office visit showed that he has vitamin D deficiency he also has hyperlipidemia.  Since his last visit he started taking 10,000 IUs of vitamin D3 daily.  He tells me in the past he has participated in intermittent fasting and meal planning, which resulted in some weight loss.  However, he has not been doing that in quite some time.  He tells me he also has a family history of heart disease.  As far as health maintenance goes he has not had a COVID-19 vaccine and tells me he is not interested in having this administered at this time.  He is up-to-date with tetanus, he is not sure about the exact date but believes he had one administer approximately 6 years ago.  He has had sexual transmitted infection screening completed in the past, and is not interested in having this repeated at this time.  He does not smoke cigarettes.  He is due for depression screening.  He does mention that he will get itchy and bloodshot eyes when he is outside working in the yard or mowing for a prolonged period of time.  He believes this is related to allergies, and is wondering what he can do to either prevent or treat this.   Past Medical History:  Diagnosis Date  . History of exercise stress test    ETT 2/18: Ex 9'27"; no ST changes; BP response was normal  . Hypertension       Family History  Problem Relation Age of Onset  . Hypertension Mother   . CAD Father 72       CABG  . Heart attack Maternal Grandfather   . 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 Smoking status: Never Smoker  . Smokeless tobacco: Never Used  Substance Use Topics  . Alcohol use: No     Current Meds  Medication Sig  . amLODipine (NORVASC) 10 MG tablet Take 1 tablet (10 mg total) by mouth daily.  . Cholecalciferol 1.25 MG (50000 UT) TABS Take 2 tablets by mouth.  Marland Kitchen lisinopril (ZESTRIL) 40 MG tablet Take 1 tablet (40 mg total) by mouth daily.    ROS:  Negative unless otherwise stated in HPI.   Objective:   Today's Vitals: BP 140/90 (BP Location: Left Arm, Patient Position: Sitting, Cuff Size: Normal)   Pulse 88   Temp 98.5 F (36.9 C) (Temporal)   Ht 6' 2" (1.88 m)   Wt 274 lb 9.6 oz (124.6 kg)   SpO2 95%   BMI 35.26 kg/m  Vitals with BMI 08/18/2019 05/27/2019 04/22/2016  Height 6' 2" 6' 2" -  Weight 274 lbs 10 oz 277 lbs 10 oz -  BMI 72.09 47.09 -  Systolic 628 366 294  Diastolic 90 88 90  Pulse 88 67  74     Physical Exam Vitals reviewed.  Constitutional:      General: He is not in acute distress.    Appearance: Normal appearance. He is not ill-appearing.  HENT:     Head: Normocephalic and atraumatic.     Right Ear: Tympanic membrane and external ear normal.     Left Ear: Tympanic membrane, ear canal and external ear normal.     Ears:     Comments: Mild redness noted to right ear canal Eyes:     General: No scleral icterus.    Extraocular Movements: Extraocular movements intact.     Conjunctiva/sclera: Conjunctivae normal.     Pupils: Pupils are equal, round, and reactive to light.  Neck:     Vascular: No carotid bruit.  Cardiovascular:     Rate and Rhythm: Normal rate and regular rhythm.     Pulses: Normal pulses.     Heart sounds: Normal heart sounds.  Pulmonary:     Effort: Pulmonary effort is normal.     Breath sounds: Normal breath sounds.  Abdominal:     General: Bowel sounds are normal. There is no distension.      Palpations: There is no mass.     Tenderness: There is no abdominal tenderness.     Hernia: No hernia is present.  Musculoskeletal:        General: No swelling or tenderness.     Cervical back: Normal range of motion and neck supple. No rigidity.  Lymphadenopathy:     Cervical: No cervical adenopathy.  Skin:    General: Skin is warm and dry.  Neurological:     General: No focal deficit present.     Mental Status: He is alert and oriented to person, place, and time.     Cranial Nerves: No cranial nerve deficit.     Sensory: No sensory deficit.     Motor: No weakness.     Gait: Gait normal.  Psychiatric:        Mood and Affect: Mood normal.        Behavior: Behavior normal.        Judgment: Judgment normal.        PHQ9 SCORE ONLY 08/18/2019  PHQ-9 Total Score 0     Assessment and Plan   1. Encounter for general adult medical examination with abnormal findings   2. Vitamin D deficiency   3. Allergic conjunctivitis of both eyes   4. Essential hypertension   5. Mixed hyperlipidemia      Plan: 1.  I did encourage him to get the COVID-19 vaccine, and if he were to change his mind to discuss with his pharmacy.  He tells me he understands.  Otherwise he is up-to-date with recommended vaccinations.  As for screenings will not repeat sexual transmitted infection screening per his request, depression screen was negative today, and he is a non-smoker.  2.  We will check vitamin D level and metabolic panel today as he has been taking his vitamin D supplement on a regular basis since last time blood work was drawn.  3.  I will prescribe him azelastine eyedrops he can use as needed prior to yard work or exposure to known allergen.  He is encouraged to know if this does not work.  4.  Blood pressure is borderline today, however he did mention that he had a fairly upsetting phone call right before entering the office today.  Will not make changes to his medications at this  time.    5.  I did discuss his blood work today including his lipid panel.  We did discuss lifestyle changes and I recommended he try to follow a plant focused diet, and reduce his intake of meat and increase his intake of vegetables.  He tells me he will consider this, as well as consider doing some intermittent fasting as tolerable.   Tests ordered Orders Placed This Encounter  Procedures  . Vitamin D, 25-hydroxy  . CMP with eGFR([facility])      Meds ordered this encounter  Medications  . azelastine (OPTIVAR) 0.05 % ophthalmic solution    Sig: Place 1 drop into both eyes 2 (two) times daily as needed.    Dispense:  6 mL    Refill:  3    Order Specific Question:   Supervising Provider    Answer:   Doree Albee [[id number]]    Patient to follow-up in 6 months or sooner as needed.  Today in addition to his annual physical exam I also completed an office visit to address his chronic conditions as well as acute complaint.  Ailene Ards, NP

## 2019-08-19 ENCOUNTER — Encounter (INDEPENDENT_AMBULATORY_CARE_PROVIDER_SITE_OTHER): Payer: Self-pay | Admitting: Nurse Practitioner

## 2019-08-19 LAB — COMPLETE METABOLIC PANEL WITH GFR
AG Ratio: 2.1 (calc) (ref 1.0–2.5)
ALT: 36 U/L (ref 9–46)
AST: 24 U/L (ref 10–40)
Albumin: 5 g/dL (ref 3.6–5.1)
Alkaline phosphatase (APISO): 62 U/L (ref 36–130)
BUN: 17 mg/dL (ref 7–25)
CO2: 28 mmol/L (ref 20–32)
Calcium: 9.9 mg/dL (ref 8.6–10.3)
Chloride: 104 mmol/L (ref 98–110)
Creat: 0.94 mg/dL (ref 0.60–1.35)
GFR, Est African American: 120 mL/min/{1.73_m2} (ref >=60)
GFR, Est Non African American: 103 mL/min/{1.73_m2} (ref >=60)
Globulin: 2.4 g/dL (calc) (ref 1.9–3.7)
Glucose, Bld: 98 mg/dL (ref 65–99)
Potassium: 4.6 mmol/L (ref 3.5–5.3)
Sodium: 139 mmol/L (ref 135–146)
Total Bilirubin: 0.4 mg/dL (ref 0.2–1.2)
Total Protein: 7.4 g/dL (ref 6.1–8.1)

## 2019-08-19 LAB — VITAMIN D 25 HYDROXY (VIT D DEFICIENCY, FRACTURES): Vit D, 25-Hydroxy: 43 ng/mL (ref 30–100)

## 2019-09-17 MED FILL — LISINOPRIL 40 MG TABS: 40 | 30 days supply | Qty: 30 | Fill #1

## 2019-09-17 MED FILL — AMLODIPINE BESYLATE 10 MG T: 10 | 30 days supply | Qty: 30 | Fill #1

## 2019-10-26 ENCOUNTER — Other Ambulatory Visit (INDEPENDENT_AMBULATORY_CARE_PROVIDER_SITE_OTHER): Payer: Self-pay | Admitting: Internal Medicine

## 2019-10-26 MED FILL — LISINOPRIL 40 MG TABS: 40 | 30 days supply | Qty: 30 | Fill #0

## 2019-10-29 ENCOUNTER — Other Ambulatory Visit (INDEPENDENT_AMBULATORY_CARE_PROVIDER_SITE_OTHER): Payer: Self-pay | Admitting: Nurse Practitioner

## 2019-10-29 ENCOUNTER — Other Ambulatory Visit (INDEPENDENT_AMBULATORY_CARE_PROVIDER_SITE_OTHER): Payer: Self-pay | Admitting: Internal Medicine

## 2019-10-29 MED FILL — AMLODIPINE BESYLATE 10 MG T: 10 | 30 days supply | Qty: 30 | Fill #0

## 2020-01-04 ENCOUNTER — Telehealth (INDEPENDENT_AMBULATORY_CARE_PROVIDER_SITE_OTHER): Payer: Self-pay

## 2020-01-04 ENCOUNTER — Other Ambulatory Visit (INDEPENDENT_AMBULATORY_CARE_PROVIDER_SITE_OTHER): Payer: Self-pay | Admitting: Internal Medicine

## 2020-01-04 MED ORDER — LISINOPRIL 40 MG PO TABS: 40.0000 mg | ORAL_TABLET | Freq: Every day | ORAL | 1 refills | Status: DC

## 2020-01-04 MED ORDER — AMLODIPINE BESYLATE 10 MG PO TABS: 10.0000 mg | ORAL_TABLET | Freq: Every day | ORAL | 0 refills | Status: DC

## 2020-01-04 NOTE — Telephone Encounter (Signed)
Patient called and left a voice message and stated that he needs a refill on:  Amlodipine 10 mg Last filled 10/29/2019, 90 with 0 refills  Lisinopril 40 mg Last filled 10/26/2019, # 90 with 1 refill  Last OV 08/18/2019 ROV 02/21/2020

## 2020-02-21 ENCOUNTER — Ambulatory Visit (INDEPENDENT_AMBULATORY_CARE_PROVIDER_SITE_OTHER): Payer: Managed Care, Other (non HMO) | Admitting: Internal Medicine

## 2020-02-24 ENCOUNTER — Ambulatory Visit (INDEPENDENT_AMBULATORY_CARE_PROVIDER_SITE_OTHER): Payer: Managed Care, Other (non HMO) | Admitting: Nurse Practitioner

## 2020-03-16 ENCOUNTER — Ambulatory Visit (INDEPENDENT_AMBULATORY_CARE_PROVIDER_SITE_OTHER): Payer: Managed Care, Other (non HMO) | Admitting: Nurse Practitioner

## 2020-06-01 ENCOUNTER — Ambulatory Visit (INDEPENDENT_AMBULATORY_CARE_PROVIDER_SITE_OTHER): Payer: Managed Care, Other (non HMO) | Admitting: Nurse Practitioner

## 2020-06-01 ENCOUNTER — Other Ambulatory Visit: Payer: Self-pay

## 2020-06-01 ENCOUNTER — Encounter (INDEPENDENT_AMBULATORY_CARE_PROVIDER_SITE_OTHER): Payer: Self-pay | Admitting: Nurse Practitioner

## 2020-06-01 VITALS — BP 166/104 | HR 74 | Temp 97.1°F | Ht 71.75 in | Wt 287.0 lb

## 2020-06-01 DIAGNOSIS — E782 Mixed hyperlipidemia: Secondary | ICD-10-CM | POA: Diagnosis not present

## 2020-06-01 DIAGNOSIS — I10 Essential (primary) hypertension: Secondary | ICD-10-CM | POA: Diagnosis not present

## 2020-06-01 DIAGNOSIS — Z1159 Encounter for screening for other viral diseases: Secondary | ICD-10-CM

## 2020-06-01 DIAGNOSIS — E559 Vitamin D deficiency, unspecified: Secondary | ICD-10-CM | POA: Diagnosis not present

## 2020-06-01 DIAGNOSIS — Z131 Encounter for screening for diabetes mellitus: Secondary | ICD-10-CM | POA: Diagnosis not present

## 2020-06-01 MED ORDER — AMLODIPINE BESYLATE 10 MG PO TABS: 10.0000 mg | ORAL_TABLET | Freq: Every day | ORAL | 0 refills | Status: AC

## 2020-06-01 MED ORDER — BLOOD PRESSURE MONITOR DEVI: 1.0000 | Freq: Every day | Status: AC | PRN

## 2020-06-01 MED ORDER — LISINOPRIL 40 MG PO TABS: 40.0000 mg | ORAL_TABLET | Freq: Every day | ORAL | 1 refills | Status: DC

## 2020-06-01 NOTE — Progress Notes (Signed)
Subjective:  Patient ID: Daniel Matthews, male    DOB: 10/19/1980  Age: 40 y.o. MRN: [id number]  CC:  Chief Complaint  Patient presents with  . Follow-up    Needs medication refills, doing okay, no concerns  . Obesity  . Hyperlipidemia  . Other    Vitamin D deficiency  . Hypertension      HPI  This patient arrives today for the above.  It has been a few months since patient has been seen, he tells me he is out of his medications and needs refills today.  He tells me his father just underwent carotid endarterectomy and this has motivated him to come back to the office and start making his health priority again.  Obesity: He has a history of obesity continues to be obese.  He would like to try and lose weight and would like to focus on lifestyle changes.  Hyperlipidemia: He has a history of hyperlipidemia.  Last lipid panel was collected approximately 1 year ago.  LDL at that time was 150.  He is fasting today.  He is not on any medication for treatment of hyperlipidemia currently.  Vitamin D deficiency: He does have a history of vitamin D deficiency and used to be on vitamin D3 supplement.  He has not been taking this regularly.  Last lab draw was in May 2021 and showed serum level of 43.  Hypertension: He has a history of hypertension and has not been taking his medications because he ran out of them.  He was on amlodipine 10 mg and lisinopril 40 mg daily.  Past Medical History:  Diagnosis Date  . History of exercise stress test    ETT 2/18: Ex 9'27"; no ST changes; BP response was normal  . Hypertension       Family History  Problem Relation Age of Onset  . Hypertension Mother   . CAD Father 40       CABG  . Heart attack Maternal Grandfather   . 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 Smoking status: Never Smoker  . Smokeless tobacco: Never Used  Substance Use Topics  . Alcohol use: No     Current Meds  Medication Sig  . Blood Pressure Monitor DEVI 1 each by Does not apply route daily as needed.  . Cholecalciferol (VITAMIN D3) 125 MCG (5000 UT) CAPS Take 2 capsules by mouth daily.  . [DISCONTINUED] amLODipine (NORVASC) 10 MG tablet Take 1 tablet (10 mg total) by mouth daily.  . [DISCONTINUED] lisinopril (ZESTRIL) 40 MG tablet Take 1 tablet (40 mg total) by mouth daily.    ROS:  Review of Systems  Eyes: Negative for blurred vision.  Respiratory: Positive for shortness of breath (with activity).   Cardiovascular: Negative for chest pain.  Neurological: Negative for dizziness and headaches.     Objective:   Today's Vitals: BP (!) 166/104   Pulse 74   Temp (!) 97.1 F (36.2 C) (Temporal)   Ht 5' 11.75" (1.822 m)   Wt 287 lb (130.2 kg)   SpO2 98%   BMI 39.20 kg/m  Vitals with BMI 06/01/2020 08/18/2019 05/27/2019  Height 5' 11.75" 6' 2"  6' 2"   Weight 287 lbs 274 lbs 10 oz 277 lbs  10 oz  BMI 39.22 51.88 41.66  Systolic 063 016 010  Diastolic 932 90 88  Pulse 74 88 67     Physical Exam Vitals reviewed.  Constitutional:      Appearance: Normal appearance.  HENT:     Head: Normocephalic and atraumatic.  Neck:     Vascular: No carotid bruit.  Cardiovascular:     Rate and Rhythm: Normal rate and regular rhythm.  Pulmonary:     Effort: Pulmonary effort is normal.     Breath sounds: Normal breath sounds.  Musculoskeletal:     Cervical back: Neck supple.  Skin:    General: Skin is warm and dry.  Neurological:     Mental Status: He is alert and oriented to person, place, and time.  Psychiatric:        Mood and Affect: Mood normal.        Behavior: Behavior normal.        Thought Content: Thought content normal.        Judgment: Judgment normal.          Assessment and Plan   1. Essential hypertension   2. Vitamin D deficiency   3.  Mixed hyperlipidemia   4. Screening for diabetes mellitus   5. Morbid obesity ([place])   6. Encounter for hepatitis C screening test for low risk patient      Plan: 1.  We will refill his antihypertensives and will prescribe at home automatic blood pressure cuff so he can monitor his blood pressure at home.  He will return in about 2 weeks to monitor blood pressure. 2.  We will check serum vitamin D level and may consider recommending that he restart vitamin D3 supplement. 3.  We will check fasting lipid panel today for further evaluation. 4.  We will check A1c today. 5.  We did discuss lifestyle changes and I will give him a handout regarding dietary recommendations.  He is interested in seeing nutrition therapy so will refer him to nutritionist as well. 6.  We will screen for hepatitis C today.   Tests ordered Orders Placed This Encounter  Procedures  . CBC with Differential/Platelets  . CMP with eGFR([facility])  . Lipid Panel  . Hemoglobin A1c  . TSH  . Vitamin D, 25-hydroxy  . Hep C Antibody  . Amb ref to Medical Nutrition Therapy-MNT      Meds ordered this encounter  Medications  . amLODipine (NORVASC) 10 MG tablet    Sig: Take 1 tablet (10 mg total) by mouth daily.    Dispense:  90 tablet    Refill:  0    Order Specific Question:   Supervising Provider    Answer:   Hurshel Party C [[id number]]  . lisinopril (ZESTRIL) 40 MG tablet    Sig: Take 1 tablet (40 mg total) by mouth daily.    Dispense:  90 tablet    Refill:  1    Order Specific Question:   Supervising Provider    Answer:   Hurshel Party C [[id number]]  . Blood Pressure Monitor DEVI    Sig: 1 each by Does not apply route daily as needed.    Dispense:  1 each    Refill:  o    Please provide automatic blood pressure cuff that is covered by patient's insurance    Order Specific Question:   Supervising Provider    Answer:   Doree Albee [[id number]]    Patient to follow-up in 2 weeks  for blood pressure check and then  again in 6 months for his annual physical.  Ailene Ards, NP

## 2020-06-01 NOTE — Patient Instructions (Signed)
[facility] Dietary Recommendations for Weight Loss What to Avoid . Avoid added sugars o Often added sugar can be found in processed foods such as many condiments, dry cereals, cakes, cookies, chips, crisps, crackers, candies, sweetened drinks, etc.  o Read labels and AVOID/DECREASE use of foods with the following in their ingredient list: Sugar, fructose, high fructose corn syrup, sucrose, glucose, maltose, dextrose, molasses, cane sugar, brown sugar, any type of syrup, agave nectar, etc.   . Avoid snacking in between meals . Avoid foods made with flour o If you are going to eat food made with flour, choose those made with whole-grains; and, minimize your consumption as much as is tolerable . Avoid processed foods o These foods are generally stocked in the middle of the grocery store. Focus on shopping on the perimeter of the grocery.  . Avoid Meat  o We recommend following a plant-based diet at [facility]. Thus, we recommend avoiding meat as a general rule. Consider eating beans, legumes, eggs, and/or dairy products for regular protein sources o If you plan on eating meat limit to 4 ounces of meat at a time and choose lean options such as Fish, chicken, turkey. Avoid red meat intake such as pork and/or steak What to Include . Vegetables o GREEN LEAFY VEGETABLES: Kale, spinach, mustard greens, collard greens, cabbage, broccoli, etc. o OTHER: Asparagus, cauliflower, eggplant, carrots, peas, Brussel sprouts, tomatoes, bell peppers, zucchini, beets, cucumbers, etc. . Grains, seeds, and legumes o Beans: kidney beans, black eyed peas, garbanzo beans, black beans, pinto beans, etc. o Whole, unrefined grains: brown rice, barley, bulgur, oatmeal, etc. . Healthy fats  o Avoid highly processed fats such as vegetable oil o Examples of healthy fats: avocado, olives, virgin olive oil, dark chocolate (?72% Cocoa), nuts (peanuts, almonds, walnuts, cashews, pecans, etc.) . None to Low  Intake of Animal Sources of Protein o Meat sources: chicken, turkey, salmon, tuna. Limit to 4 ounces of meat at one time. o Consider limiting dairy sources, but when choosing dairy focus on: PLAIN Greek yogurt, cottage cheese, high-protein milk . Fruit o Choose berries  When to Eat . Intermittent Fasting: o Choosing not to eat for a specific time period, but DO FOCUS ON HYDRATION when fasting o Multiple Techniques: - Time Restricted Eating: eat 3 meals in a day, each meal lasting no more than 60 minutes, no snacks between meals - 16-18 hour fast: fast for 16 to 18 hours up to 7 days a week. Often suggested to start with 2-3 nonconsecutive days per week.  . Remember the time you sleep is counted as fasting.  . Examples of eating schedule: Fast from 7:00pm-11:00am. Eat between 11:00am-7:00pm.  - 24-hour fast: fast for 24 hours up to every other day. Often suggested to start with 1 day per week . Remember the time you sleep is counted as fasting . Examples of eating schedule:  o Eating day: eat 2-3 meals on your eating day. If doing 2 meals, each meal should last no more than 90 minutes. If doing 3 meals, each meal should last no more than 60 minutes. Finish last meal by 7:00pm. o Fasting day: Fast until 7:00pm.  o IF YOU FEEL UNWELL FOR ANY REASON/IN ANY WAY WHEN FASTING, STOP FASTING BY EATING A NUTRITIOUS SNACK OR LIGHT MEAL o ALWAYS FOCUS ON HYDRATION DURING FASTS - Acceptable Hydration sources: water, broths, tea/coffee (black tea/coffee is best but using a small amount of whole-fat dairy products in coffee/tea is acceptable).  -   Poor Hydration Sources: anything with sugar or artificial sweeteners added to it  These recommendations have been developed for patients that are actively receiving medical care from either Dr. [facility] or Caliah Kopke, DNP, NP-C at [facility]. These recommendations are developed for patients with specific medical conditions and are not meant to be  distributed or used by others that are not actively receiving care from either provider listed above at [facility]. It is not appropriate to participate in the above eating plans without proper medical supervision.   Reference: Fung, J. The obesity code. [place]: [place]; 2016.   

## 2020-06-02 LAB — COMPLETE METABOLIC PANEL WITH GFR
AG Ratio: 2.2 (calc) (ref 1.0–2.5)
ALT: 63 U/L — ABNORMAL HIGH (ref 9–46)
AST: 31 U/L (ref 10–40)
Albumin: 4.8 g/dL (ref 3.6–5.1)
Alkaline phosphatase (APISO): 58 U/L (ref 36–130)
BUN: 13 mg/dL (ref 7–25)
CO2: 27 mmol/L (ref 20–32)
Calcium: 9.7 mg/dL (ref 8.6–10.3)
Chloride: 103 mmol/L (ref 98–110)
Creat: 0.78 mg/dL (ref 0.60–1.35)
GFR, Est African American: 133 mL/min/{1.73_m2} (ref >=60)
GFR, Est Non African American: 115 mL/min/{1.73_m2} (ref >=60)
Globulin: 2.2 g/dL (calc) (ref 1.9–3.7)
Glucose, Bld: 93 mg/dL (ref 65–139)
Potassium: 4.2 mmol/L (ref 3.5–5.3)
Sodium: 138 mmol/L (ref 135–146)
Total Bilirubin: 0.7 mg/dL (ref 0.2–1.2)
Total Protein: 7 g/dL (ref 6.1–8.1)

## 2020-06-02 LAB — CBC WITH DIFFERENTIAL/PLATELET
Absolute Monocytes: 656 cells/uL (ref 200–950)
Basophils Absolute: 97 cells/uL (ref 0–200)
Basophils Relative: 1.2 %
Eosinophils Absolute: 446 cells/uL (ref 15–500)
Eosinophils Relative: 5.5 %
HCT: 43.9 % (ref 38.5–50.0)
Hemoglobin: 15.1 g/dL (ref 13.2–17.1)
Lymphs Abs: 2495 cells/uL (ref 850–3900)
MCH: 30.5 pg (ref 27.0–33.0)
MCHC: 34.4 g/dL (ref 32.0–36.0)
MCV: 88.7 fL (ref 80.0–100.0)
MPV: 9.6 fL (ref 7.5–12.5)
Monocytes Relative: 8.1 %
Neutro Abs: 4406 cells/uL (ref 1500–7800)
Neutrophils Relative %: 54.4 %
Platelets: 336 10*3/uL (ref 140–400)
RBC: 4.95 10*6/uL (ref 4.20–5.80)
RDW: 12.5 % (ref 11.0–15.0)
Total Lymphocyte: 30.8 %
WBC: 8.1 10*3/uL (ref 3.8–10.8)

## 2020-06-02 LAB — LIPID PANEL
Cholesterol: 213 mg/dL — ABNORMAL HIGH (ref <200)
HDL: 37 mg/dL — ABNORMAL LOW (ref >=40)
LDL Cholesterol (Calc): 141 mg/dL (calc) — ABNORMAL HIGH
Non-HDL Cholesterol (Calc): 176 mg/dL (calc) — ABNORMAL HIGH (ref <130)
Total CHOL/HDL Ratio: 5.8 (calc) — ABNORMAL HIGH (ref <5.0)
Triglycerides: 211 mg/dL — ABNORMAL HIGH (ref <150)

## 2020-06-02 LAB — HEMOGLOBIN A1C
Hgb A1c MFr Bld: 5.2 % of total Hgb (ref <5.7)
Mean Plasma Glucose: 103 mg/dL
eAG (mmol/L): 5.7 mmol/L

## 2020-06-02 LAB — HEPATITIS C ANTIBODY
Hepatitis C Ab: NONREACTIVE
SIGNAL TO CUT-OFF: 0.01 (ref <1.00)

## 2020-06-02 LAB — VITAMIN D 25 HYDROXY (VIT D DEFICIENCY, FRACTURES): Vit D, 25-Hydroxy: 25 ng/mL — ABNORMAL LOW (ref 30–100)

## 2020-06-02 LAB — TSH: TSH: 1.57 mIU/L (ref 0.40–4.50)

## 2020-06-15 ENCOUNTER — Other Ambulatory Visit: Payer: Self-pay

## 2020-06-15 ENCOUNTER — Ambulatory Visit (INDEPENDENT_AMBULATORY_CARE_PROVIDER_SITE_OTHER): Payer: Managed Care, Other (non HMO) | Admitting: Nurse Practitioner

## 2020-06-15 VITALS — BP 138/96 | HR 85 | Temp 98.1°F | Ht 71.5 in | Wt 290.4 lb

## 2020-06-15 DIAGNOSIS — I10 Essential (primary) hypertension: Secondary | ICD-10-CM

## 2020-06-15 NOTE — Progress Notes (Signed)
Subjective:  Patient ID: Daniel Matthews, male    DOB: 07-06-1980  Age: 40 y.o. MRN: [id number]  CC:  Chief Complaint  Patient presents with  . Hypertension      HPI  This patient arrives today for the above.  At his last office visit we restarted his amlodipine and lisinopril.  He tells me is tolerating medication well.  He has been checking his blood pressure at home as well, and tells me that generally he is getting systolic readings of 1 05-1 40 and diastolic readings around 70.  Past Medical History:  Diagnosis Date  . History of exercise stress test    ETT 2/18: Ex 9'27"; no ST changes; BP response was normal  . Hypertension       Family History  Problem Relation Age of Onset  . Hypertension Mother   . CAD Father 62       CABG  . Heart attack Maternal Grandfather   . 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 Smoking status: Never Smoker  . Smokeless tobacco: Never Used  Substance Use Topics  . Alcohol use: No     Current Meds  Medication Sig  . amLODipine (NORVASC) 10 MG tablet Take 1 tablet (10 mg total) by mouth daily.  . Cholecalciferol (VITAMIN D3) 125 MCG (5000 UT) CAPS Take 2 capsules by mouth daily.  Marland Kitchen lisinopril (ZESTRIL) 40 MG tablet Take 1 tablet (40 mg total) by mouth daily.    ROS:  Review of Systems  Eyes: Negative for blurred vision.  Respiratory: Negative for shortness of breath.   Cardiovascular: Negative for chest pain.  Neurological: Negative for dizziness and headaches.     Objective:   Today's Vitals: BP (!) 138/96   Pulse 85   Temp 98.1 F (36.7 C)   Ht 5' 11.5" (1.816 m)   Wt 290 lb 6.4 oz (131.7 kg)   SpO2 95%   BMI 39.94 kg/m  Vitals with BMI 06/15/2020 06/01/2020 08/18/2019  Height 5' 11.5" 5' 11.75" '6\' 2"'   Weight 290 lbs 6 oz 287 lbs  274 lbs 10 oz  BMI 39.94 10.21 11.73  Systolic 567 014 103  Diastolic 96 013 90  Pulse 85 74 88     Physical Exam Vitals reviewed.  Constitutional:      Appearance: Normal appearance.  HENT:     Head: Normocephalic and atraumatic.  Cardiovascular:     Rate and Rhythm: Normal rate and regular rhythm.  Pulmonary:     Effort: Pulmonary effort is normal.     Breath sounds: Normal breath sounds.  Musculoskeletal:     Cervical back: Neck supple.  Skin:    General: Skin is warm and dry.  Neurological:     Mental Status: He is alert and oriented to person, place, and time.  Psychiatric:        Mood and Affect: Mood normal.        Behavior: Behavior normal.        Thought Content: Thought content normal.        Judgment: Judgment normal.          Assessment and Plan   1. Essential hypertension      Plan: 1.  Recheck CMP today,  blood pressure much improved compared to last visit.  He will continue taking his antihypertensives as prescribed.   Tests ordered Orders Placed This Encounter  Procedures  . CMP with eGFR([facility])      No orders of the defined types were placed in this encounter.   Patient to follow-up in as scheduled in September or sooner as needed.  Ailene Ards, NP

## 2020-06-16 LAB — COMPLETE METABOLIC PANEL WITH GFR
AG Ratio: 1.9 (calc) (ref 1.0–2.5)
ALT: 64 U/L — ABNORMAL HIGH (ref 9–46)
AST: 35 U/L (ref 10–40)
Albumin: 4.8 g/dL (ref 3.6–5.1)
Alkaline phosphatase (APISO): 60 U/L (ref 36–130)
BUN: 13 mg/dL (ref 7–25)
CO2: 29 mmol/L (ref 20–32)
Calcium: 9.5 mg/dL (ref 8.6–10.3)
Chloride: 100 mmol/L (ref 98–110)
Creat: 0.92 mg/dL (ref 0.60–1.35)
GFR, Est African American: 122 mL/min/{1.73_m2} (ref >=60)
GFR, Est Non African American: 105 mL/min/{1.73_m2} (ref >=60)
Globulin: 2.5 g/dL (calc) (ref 1.9–3.7)
Glucose, Bld: 99 mg/dL (ref 65–139)
Potassium: 4.5 mmol/L (ref 3.5–5.3)
Sodium: 137 mmol/L (ref 135–146)
Total Bilirubin: 0.4 mg/dL (ref 0.2–1.2)
Total Protein: 7.3 g/dL (ref 6.1–8.1)

## 2020-06-24 ENCOUNTER — Other Ambulatory Visit (HOSPITAL_COMMUNITY): Payer: Self-pay

## 2020-07-06 ENCOUNTER — Encounter (INDEPENDENT_AMBULATORY_CARE_PROVIDER_SITE_OTHER): Payer: Self-pay

## 2020-10-26 ENCOUNTER — Encounter (INDEPENDENT_AMBULATORY_CARE_PROVIDER_SITE_OTHER): Payer: Self-pay

## 2020-11-30 ENCOUNTER — Other Ambulatory Visit (INDEPENDENT_AMBULATORY_CARE_PROVIDER_SITE_OTHER): Payer: Self-pay | Admitting: Nurse Practitioner

## 2020-11-30 DIAGNOSIS — E559 Vitamin D deficiency, unspecified: Secondary | ICD-10-CM

## 2020-11-30 DIAGNOSIS — Z131 Encounter for screening for diabetes mellitus: Secondary | ICD-10-CM

## 2020-11-30 DIAGNOSIS — I10 Essential (primary) hypertension: Secondary | ICD-10-CM

## 2020-11-30 DIAGNOSIS — E782 Mixed hyperlipidemia: Secondary | ICD-10-CM

## 2020-12-06 ENCOUNTER — Encounter (INDEPENDENT_AMBULATORY_CARE_PROVIDER_SITE_OTHER): Payer: Managed Care, Other (non HMO) | Admitting: Internal Medicine

## 2023-04-10 DIAGNOSIS — L658 Other specified nonscarring hair loss: Secondary | ICD-10-CM | POA: Diagnosis not present

## 2023-04-10 DIAGNOSIS — R891 Abnormal level of hormones in specimens from other organs, systems and tissues: Secondary | ICD-10-CM | POA: Diagnosis not present

## 2023-04-10 DIAGNOSIS — R5382 Chronic fatigue, unspecified: Secondary | ICD-10-CM | POA: Diagnosis not present

## 2023-08-07 DIAGNOSIS — R5382 Chronic fatigue, unspecified: Secondary | ICD-10-CM | POA: Diagnosis not present

## 2023-08-07 DIAGNOSIS — R891 Abnormal level of hormones in specimens from other organs, systems and tissues: Secondary | ICD-10-CM | POA: Diagnosis not present

## 2023-08-07 DIAGNOSIS — L658 Other specified nonscarring hair loss: Secondary | ICD-10-CM | POA: Diagnosis not present
